# Patient Record
Sex: Female | Born: 1960 | Race: White | Hispanic: No | Marital: Single | Smoking: Never smoker
Health system: Southern US, Community
[De-identification: ages and names within clinical notes are randomized; demographics above are authoritative.]

## PROBLEM LIST (undated history)

## (undated) HISTORY — PX: PARATHYROIDECTOMY: SHX19

---

## 2001-07-17 ENCOUNTER — Ambulatory Visit (HOSPITAL_COMMUNITY): Admission: RE | Admit: 2001-07-17 | Discharge: 2001-07-17 | Payer: Self-pay | Admitting: Podiatry

## 2001-07-17 ENCOUNTER — Encounter: Payer: Self-pay | Admitting: Podiatry

## 2003-03-07 ENCOUNTER — Emergency Department (HOSPITAL_COMMUNITY): Admission: EM | Admit: 2003-03-07 | Discharge: 2003-03-07 | Payer: Self-pay | Admitting: Emergency Medicine

## 2003-05-20 ENCOUNTER — Ambulatory Visit (HOSPITAL_COMMUNITY): Admission: RE | Admit: 2003-05-20 | Discharge: 2003-05-20 | Payer: Self-pay | Admitting: Gynecology

## 2003-06-11 ENCOUNTER — Other Ambulatory Visit: Admission: RE | Admit: 2003-06-11 | Discharge: 2003-06-11 | Payer: Self-pay | Admitting: Gynecology

## 2003-06-16 ENCOUNTER — Emergency Department (HOSPITAL_COMMUNITY): Admission: EM | Admit: 2003-06-16 | Discharge: 2003-06-16 | Payer: Self-pay | Admitting: Emergency Medicine

## 2003-06-16 ENCOUNTER — Ambulatory Visit (HOSPITAL_COMMUNITY): Admission: RE | Admit: 2003-06-16 | Discharge: 2003-06-16 | Payer: Self-pay | Admitting: Gynecology

## 2003-09-28 ENCOUNTER — Emergency Department (HOSPITAL_COMMUNITY): Admission: EM | Admit: 2003-09-28 | Discharge: 2003-09-28 | Payer: Self-pay | Admitting: Emergency Medicine

## 2003-11-27 ENCOUNTER — Ambulatory Visit (HOSPITAL_COMMUNITY): Admission: RE | Admit: 2003-11-27 | Discharge: 2003-11-27 | Payer: Self-pay | Admitting: Pulmonary Disease

## 2003-12-01 ENCOUNTER — Other Ambulatory Visit: Admission: RE | Admit: 2003-12-01 | Discharge: 2003-12-01 | Payer: Self-pay | Admitting: Gynecology

## 2004-06-16 ENCOUNTER — Ambulatory Visit (HOSPITAL_COMMUNITY): Admission: RE | Admit: 2004-06-16 | Discharge: 2004-06-16 | Payer: Self-pay | Admitting: Family Medicine

## 2004-11-10 ENCOUNTER — Ambulatory Visit (HOSPITAL_COMMUNITY): Admission: RE | Admit: 2004-11-10 | Discharge: 2004-11-10 | Payer: Self-pay | Admitting: Family Medicine

## 2005-03-23 ENCOUNTER — Ambulatory Visit: Payer: Self-pay | Admitting: Cardiology

## 2005-12-19 ENCOUNTER — Encounter: Admission: RE | Admit: 2005-12-19 | Discharge: 2005-12-19 | Payer: Self-pay

## 2005-12-22 ENCOUNTER — Encounter: Admission: RE | Admit: 2005-12-22 | Discharge: 2005-12-22 | Payer: Self-pay

## 2006-01-15 ENCOUNTER — Encounter (INDEPENDENT_AMBULATORY_CARE_PROVIDER_SITE_OTHER): Payer: Self-pay | Admitting: Specialist

## 2006-01-15 ENCOUNTER — Ambulatory Visit (HOSPITAL_COMMUNITY): Admission: RE | Admit: 2006-01-15 | Discharge: 2006-01-16 | Payer: Self-pay | Admitting: General Surgery

## 2008-11-11 ENCOUNTER — Ambulatory Visit (HOSPITAL_COMMUNITY): Admission: RE | Admit: 2008-11-11 | Discharge: 2008-11-11 | Payer: Self-pay | Admitting: Internal Medicine

## 2008-12-05 ENCOUNTER — Encounter: Admission: RE | Admit: 2008-12-05 | Discharge: 2008-12-05 | Payer: Self-pay

## 2010-06-03 NOTE — Op Note (Signed)
Brandi Richardson, Brandi Richardson NO.:  [id number]   MEDICAL RECORD NO.:  [id number]          PATIENT TYPE:  OIB   LOCATION:  2550                         FACILITY:  [facility]   PHYSICIAN:  Angelia Mould. Derrell Lolling, M.D.DATE OF BIRTH:  1960-10-29   DATE OF PROCEDURE:  01/15/2006  DATE OF DISCHARGE:                               OPERATIVE REPORT   PREOPERATIVE DIAGNOSIS:  Primary hyperparathyroidism.   POSTOPERATIVE DIAGNOSIS:  Primary hyperparathyroidism.   OPERATION PERFORMED:  Minimally invasive radionuclide localized  parathyroidectomy, frozen section.   SURGEON:  Angelia Mould. Derrell Lolling, M.D.   OPERATIVE INDICATIONS:  This is a 50 year old white female who has  rheumatologic problems with joint pain.  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  She has had elevated calcium which has  been 10.9, 11.2, and 11.0.  She has significantly elevated parathyroid  hormone level.  She had a nuclear medicine parathyroid scan 3 weeks ago  which showed faint activity along the lower pole of the right lobe, only  seen on static images.  She then had an MRI which showed a small soft  tissue nodule along the inferior aspect of the right thyroid lobe;  appearing to correlate with the parathyroid scan; and possibly to  represent a parathyroid adenoma.  The patient is brought to operating  room electively for parathyroidectomy.   OPERATIVE FINDINGS:  The patient had a parathyroid adenoma in the right  inferior position, which measured 13 mm x 8 mm x 4 mm in size; and on  frozen section was consistent with parathyroid tissue.  This had  radioactivity about 1-1/2 times the background activity.   OPERATIVE TECHNIQUE:  The patient underwent injection of sestamibi  radioactive isotope by the nuclear medicine staff at 12:30 p.m.  She was  taken to the operating room.  General anesthesia with endotracheal tube  was  induced.  She was placed in a reverse Trendelenburg position with  the neck extended.  The neck and upper chest were prepped and draped in  a sterile fashion.  I used the NeoProbe; and the right inferior pole  neck appeared to have about 1-1/2 times the background activity.  A  curved incision was made from the midline laterally, on the right,  approximately 2.5 cm in length.  Dissection was carried down through the  subcutaneous tissue and through the platysma muscle.  The platysma  muscle was dissected away from the underlying strap muscles; and a self-  retaining retractor was placed.  The strap muscles were divided in the  midline; and the right strap muscles were dissected away from the  thyroid gland with gentle blunt dissection.  Some small venous channels  were controlled with metal clips and divided.   As we continued our dissection we identified what appeared to be a  parathyroid adenoma just inferior to the lower pole of the thyroid  gland.  This had a reddish brown color; and dissected away from the  thyroid tissue fairly easily.  A couple of vascular channels were  controlled  with metal clips.  The gland was measured with increased size  as described above.  Dr. Jimmy Picket performed a frozen section; and  said that this was clearly parathyroid tissue; and consistent with an  adenoma.  No further dissection was required.   The wound was irrigated with saline.  Some small Surgicel gauze was  placed in the wound; and observed for about 10 minutes during the frozen  section; there was no bleeding.  The strap muscles were closed in the  midline with interrupted sutures of 3-0 Vicryl.  The platysma muscle was  closed with interrupted suture of 3-0 Vicryl; and the skin closed with a  running subcuticular suture of 4-0 Monocryl and Steri-Strips.  Clean  bandages were placed; and the patient taken to recovery room in stable  condition.  Estimated blood loss was about 10 mL.   Complications none.  Sponge, needle, and instrument counts were correct.      Angelia Mould. Derrell Lolling, M.D.  Electronically Signed     HMI/MEDQ  D:  01/15/2006  T:  01/15/2006  Job:  [id number]   cc:   Areatha Keas, M.D.

## 2010-07-11 ENCOUNTER — Other Ambulatory Visit (HOSPITAL_COMMUNITY): Payer: Self-pay | Admitting: Family Medicine

## 2010-07-11 DIAGNOSIS — Z139 Encounter for screening, unspecified: Secondary | ICD-10-CM

## 2010-07-18 ENCOUNTER — Ambulatory Visit (HOSPITAL_COMMUNITY)
Admission: RE | Admit: 2010-07-18 | Discharge: 2010-07-18 | Disposition: A | Discharge status: Home / Self Care | Payer: BC Managed Care – PPO | Source: Ambulatory Visit | Attending: Family Medicine | Admitting: Family Medicine

## 2010-07-18 DIAGNOSIS — Z1231 Encounter for screening mammogram for malignant neoplasm of breast: Secondary | ICD-10-CM | POA: Insufficient documentation

## 2010-07-18 DIAGNOSIS — Z139 Encounter for screening, unspecified: Secondary | ICD-10-CM

## 2014-09-02 ENCOUNTER — Ambulatory Visit (HOSPITAL_COMMUNITY): Payer: BLUE CROSS/BLUE SHIELD | Attending: Internal Medicine | Admitting: Physical Therapy

## 2014-09-02 DIAGNOSIS — M545 Low back pain, unspecified: Secondary | ICD-10-CM

## 2014-09-02 DIAGNOSIS — M2569 Stiffness of other specified joint, not elsewhere classified: Secondary | ICD-10-CM

## 2014-09-02 DIAGNOSIS — M2560 Stiffness of unspecified joint, not elsewhere classified: Secondary | ICD-10-CM | POA: Insufficient documentation

## 2014-09-02 DIAGNOSIS — R198 Other specified symptoms and signs involving the digestive system and abdomen: Secondary | ICD-10-CM | POA: Diagnosis present

## 2014-09-02 DIAGNOSIS — M25652 Stiffness of left hip, not elsewhere classified: Secondary | ICD-10-CM | POA: Diagnosis present

## 2014-09-02 DIAGNOSIS — M6289 Other specified disorders of muscle: Secondary | ICD-10-CM | POA: Diagnosis present

## 2014-09-02 DIAGNOSIS — R293 Abnormal posture: Secondary | ICD-10-CM | POA: Diagnosis present

## 2014-09-02 DIAGNOSIS — R262 Difficulty in walking, not elsewhere classified: Secondary | ICD-10-CM | POA: Diagnosis present

## 2014-09-02 DIAGNOSIS — M6281 Muscle weakness (generalized): Secondary | ICD-10-CM

## 2014-09-02 DIAGNOSIS — M25651 Stiffness of right hip, not elsewhere classified: Secondary | ICD-10-CM | POA: Diagnosis present

## 2014-09-02 NOTE — Therapy (Signed)
[facility] [address] [place], [place], [address] Phone: [phone]   Fax:  [phone]  Physical Therapy Evaluation  Patient Details  Name: Brandi Richardson MRN: [id number] Date of Birth: 1960-04-18 Referring Provider:  Alben Deeds, MD  Encounter Date: 09/02/2014      PT End of Session - 09/02/14 1816    Visit Number 1   Number of Visits 12   Date for PT Re-Evaluation 09/30/14   Authorization Type BCBS    Authorization Time Period 09/02/14 to 11/02/14   PT Start Time 1733   PT Stop Time 1812   PT Time Calculation (min) 39 min   Activity Tolerance Patient tolerated treatment well   Behavior During Therapy [facility] for tasks assessed/performed      No past medical history on file.  No past surgical history on file.  There were no vitals filed for this visit.  Visit Diagnosis:  Midline low back pain without sciatica - Plan: PT plan of care cert/re-cert  Abdominal weakness - Plan: PT plan of care cert/re-cert  Proximal muscle weakness - Plan: PT plan of care cert/re-cert  Difficulty walking - Plan: PT plan of care cert/re-cert  Hip stiffness, left - Plan: PT plan of care cert/re-cert  Hip stiffness, right - Plan: PT plan of care cert/re-cert  Back stiffness - Plan: PT plan of care cert/re-cert  Poor posture - Plan: PT plan of care cert/re-cert      Subjective Assessment - 09/02/14 1736    Subjective Back is usually painful, driving sets it off. Pain feels like it pinches somewhere and runs around the front of her leg and she has numbness/pain in her quad area. Pain starts in back and moves to R hip typically.    Pertinent History  History of cyst removal L foot. Back first started bothering her around July 25th when she went back to work after being on vacation. Had driven around 604 miles for work before vacation, vacation was a 4 hour drive. Also mows the yard on a riding lawn mower, sometimes she will go over ditch  and she thinks this may have aggravated condition.    How long can you sit comfortably? 30 minutes    How long can you stand comfortably? Back gets a little sore after about 30=45 minutes    How long can you walk comfortably? R leg is going numb during gait of 60 minutes in duration    Patient Stated Goals stop back and especially R hip pain    Currently in Pain? Yes   Pain Score 3    Pain Location Back  low back and right hip   Pain Orientation Other (Comment)  low back and right hip             [facility] PT Assessment - 09/02/14 0001    Assessment   Medical Diagnosis low back pain    Onset Date/Surgical Date 08/10/14   Next MD Visit November with [facility]   Precautions   Precautions None   Restrictions   Weight Bearing Restrictions No   Balance Screen   Has the patient fallen in the past 6 months No   Has the patient had a decrease in activity level because of a fear of falling?  Yes   Is the patient reluctant to leave their home because of a fear of falling?  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    Observation/Other Assessments   Observations no N/T in bowel area; FABER positive R hip (pressure, pinching sensation); scour test negative     Focus on Therapeutic Outcomes (FOTO)  36% limited    Sensation   Additional Comments sensation to light touch in B LE dermatomes appears intact, as does pressure sensation    Posture/Postural Control   Posture Comments flexed at hips, forward head with B IR shoulders   AROM   Right Hip External Rotation  --  WFL    Right Hip Internal Rotation  28   Left Hip External Rotation  --  [facility]    Left Hip Internal Rotation  30   Lumbar Flexion 90  pain R hip    Lumbar Extension 20  pain low back and R hip    Lumbar - Right Side Bend 23   Lumbar - Left Side Bend 18   Lumbar  - Right Rotation --  approx 70% limited    Lumbar - Left Rotation --  approx 70% limited    Strength   Overall Strength Comments gross core strength appears to be 2/5 to 2+/5    Right Hip Flexion 4-/5   Right Hip Extension 2/5   Right Hip ABduction 3-/5   Left Hip Flexion 4/5   Left Hip Extension 2/5   Left Hip ABduction 3/5   Right Knee Flexion 4/5   Right Knee Extension 4/5   Left Knee Flexion 4/5   Left Knee Extension 4/5   Right Ankle Dorsiflexion 4/5   Left Ankle Dorsiflexion 5/5   Flexibility   Hamstrings minimal limitation B   Piriformis moderation limitation B    Ambulation/Gait   Gait Comments proximal muscle weakness, flexed at hips, reduced gait speed, possible LLD, reduced rotation of hips and trunk                           PT Education - 09/02/14 1816    Education provided Yes   Education Details prognosis, plan of care, HEP moving forward    Person(s) Educated Patient   Methods Explanation;Handout   Comprehension Verbalized understanding;Returned demonstration          PT Short Term Goals - 09/02/14 1824    PT SHORT TERM GOAL #1   Title Patient will be able to verbally state the importance of maintaining good posture during functional tasks and will perform all funtional tasks with good posture 80% of the time    Time 3   Period Weeks   Status New   PT SHORT TERM GOAL #2   Title Patient will demonstrate bilateral hip ER of at least 38 degrees bilaterally    Time 3   Period Weeks   Status New   PT SHORT TERM GOAL #3   Title Patient will reduce lumbar spine rotation limitations to no more than 40% limited with pain 0/10 with supine testing    Time 3   Period Weeks   Status New   PT SHORT TERM GOAL #4   Title Patient will be independent in correctly and consistently performing appropriate HEP, to be updated PRN    Time 3   Period Weeks   Status New           PT Long Term Goals - 09/02/14 1827    PT LONG TERM GOAL #1    Title Patient will  demonstrate bilateral lower extremity strength of 5/5, proximal muscle strength of at least 4/5, and core strength of at least 4-/5    Time 6   Period Weeks   Status New   PT LONG TERM GOAL #2   Title 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 no more than 1/10 and minimal numbness/tingling in R LE    Time 6   Period Weeks   Status New   PT LONG TERM GOAL #3   Title Patient to report that she has been able to perform regular exercise at a moderate intensity for at least 30 minutes, and at least 3 times per week in order to promote improved health habits and reduce symptomology    Time 3   Period Weeks   Status New   PT LONG TERM GOAL #4   Title Patient will be able to perform static standing tasks and functional gait based tasks for at least 2 hours with pain no more than 1/10 and minimal R LE numbness/tingling    Time 6   Period Weeks   Status New               Plan - 09/02/14 1817    Clinical Impression Statement Patient presents with low back and right hip pain, postural and gait impairment, weakness in bilateral lower extremities and proximal muscles/core, muscle tightness, pain and numbness/tingling with walking that goes down her R leg, reduced functional activity and task performance secondary to pain and alterred sensation.  Patient reports that it is more her R hip bothering her, as the pain starts in her back and eminates out this way with time. She also reports that the problem originally appeared to ahve started after she had been sitting, driving, for a long time for her work as a Patent examiner. Note that numbness and tingling seems to appear in region of femoral nerve on R leg. At this time patient will benefit from skilled PT services in order to address her deficits and assist her in reaching an optimal level of function.    Pt will benefit from skilled therapeutic intervention in  order to improve on the following deficits Abnormal gait;Hypomobility;Decreased activity tolerance;Decreased strength;Pain;Decreased mobility;Difficulty walking;Improper body mechanics;Decreased coordination;Impaired flexibility;Postural dysfunction   Rehab Potential Good   PT Frequency 2x / week   PT Duration 6 weeks   PT Treatment/Interventions ADLs/Self Care Home Management;Moist Heat;Gait training;Stair training;Functional mobility training;Therapeutic activities;Balance training;Therapeutic exercise;Neuromuscular re-education;Patient/family education;Manual techniques   PT Next Visit Plan review HEP and goals; functional strengthening and stretching as tolerated, trial distraction to R hip, check for leg length discrepancy    PT Home Exercise Plan given    Consulted and Agree with Plan of Care Patient         Problem List There are no active problems to display for this patient.   Nedra Hai PT, DPT [phone]  [facility] [address] [place], [place], [address] Phone: [phone]   Fax:  [phone]

## 2014-09-02 NOTE — Patient Instructions (Signed)
   BRIDGING  While lying on your back, tighten your lower abdominals, squeeze your buttocks and then raise your buttocks off the floor/bed as creating a "Bridge" with your body.  Repeat 10 times, 2-3 times a day.     HIP EXTENSION - STANDING  While standing, move your leg back as shown.  Use your arms for support if needed for balance and safety.   Repeat 10 times, 2-3 times per day.    HIP ABDUCTION - STANDING   While standing, raise your leg out to the side. Keep your knee straight and maintain your toes pointed forward the entire time.    Use your arms for support if needed for balance and safety.  Repeat 10 times, 2-3 times per day.     PIRIFORMIS AND HIP STRETCH - SEATED  While sitting in a chair, cross your affected leg on top of the other as shown.   Next, gently lean forward until a stretch is felt along the crossed leg.  Hold for 30 seconds and repeat 3 times each side, twice a day.

## 2014-09-09 ENCOUNTER — Ambulatory Visit (HOSPITAL_COMMUNITY): Payer: BLUE CROSS/BLUE SHIELD

## 2014-09-09 DIAGNOSIS — R293 Abnormal posture: Secondary | ICD-10-CM

## 2014-09-09 DIAGNOSIS — R198 Other specified symptoms and signs involving the digestive system and abdomen: Secondary | ICD-10-CM

## 2014-09-09 DIAGNOSIS — M2569 Stiffness of other specified joint, not elsewhere classified: Secondary | ICD-10-CM

## 2014-09-09 DIAGNOSIS — M545 Low back pain, unspecified: Secondary | ICD-10-CM

## 2014-09-09 DIAGNOSIS — M2560 Stiffness of unspecified joint, not elsewhere classified: Secondary | ICD-10-CM

## 2014-09-09 DIAGNOSIS — M25652 Stiffness of left hip, not elsewhere classified: Secondary | ICD-10-CM

## 2014-09-09 DIAGNOSIS — R262 Difficulty in walking, not elsewhere classified: Secondary | ICD-10-CM

## 2014-09-09 DIAGNOSIS — M6281 Muscle weakness (generalized): Secondary | ICD-10-CM

## 2014-09-09 DIAGNOSIS — M25651 Stiffness of right hip, not elsewhere classified: Secondary | ICD-10-CM

## 2014-09-09 NOTE — Therapy (Signed)
[facility] [address] [place], [place], [address] Phone: [phone]   Fax:  [phone]  Physical Therapy Treatment  Patient Details  Name: Brandi Richardson MRN: [id number] Date of Birth: February 15, 1960 Referring Provider:  Alben Deeds, MD  Encounter Date: 09/09/2014      PT End of Session - 09/09/14 1905    Visit Number 2   Number of Visits 12   Authorization Type BCBS    Authorization Time Period 09/02/14 to 11/02/14   PT Start Time 1820   PT Stop Time 1904   PT Time Calculation (min) 44 min   Activity Tolerance Patient tolerated treatment well   Behavior During Therapy [facility] for tasks assessed/performed      No past medical history on file.  No past surgical history on file.  There were no vitals filed for this visit.  Visit Diagnosis:  Midline low back pain without sciatica  Abdominal weakness  Proximal muscle weakness  Difficulty walking  Hip stiffness, left  Hip stiffness, right  Back stiffness  Poor posture      Subjective Assessment - 09/09/14 1820    Subjective Pt stated Rt hip pain with burning pain scale 2/10, usually pain following sitting to stand.  Reports compliance with HEP daily without questions.       Currently in Pain? Yes   Pain Score 2    Pain Location Hip   Pain Orientation Right   Pain Descriptors / Indicators Burning            [facility] Adult PT Treatment/Exercise - 09/09/14 0001    Exercises   Exercises Lumbar   Lumbar Exercises: Stretches   Active Hamstring Stretch 3 reps;30 seconds   Active Hamstring Stretch Limitations supine with rope   Lower Trunk Rotation 5 reps;10 seconds   Piriformis Stretch 3 reps;30 seconds   Piriformis Stretch Limitations seated    Lumbar Exercises: Standing   Other Standing Lumbar Exercises 3D hip excursion 10x   Other Standing Lumbar Exercises Standing abduction 10x (cueing for form and to slow down)   Lumbar Exercises: Seated   Other Seated  Lumbar Exercises 3D thoracic excursion 10   Lumbar Exercises: Supine   Ab Set 10 reps;5 seconds   AB Set Limitations verbal cueing   Bent Knee Raise 10 reps;5 seconds   Bridge 10 reps   Straight Leg Raise 10 reps            PT Short Term Goals - 09/02/14 1824    PT SHORT TERM GOAL #1   Title Patient will be able to verbally state the importance of maintaining good posture during functional tasks and will perform all funtional tasks with good posture 80% of the time    Time 3   Period Weeks   Status New   PT SHORT TERM GOAL #2   Title Patient will demonstrate bilateral hip ER of at least 38 degrees bilaterally    Time 3   Period Weeks   Status New   PT SHORT TERM GOAL #3   Title Patient will reduce lumbar spine rotation limitations to no more than 40% limited with pain 0/10 with supine testing    Time 3   Period Weeks   Status New   PT SHORT TERM GOAL #4   Title Patient will be independent in correctly and consistently performing appropriate HEP, to be updated PRN    Time 3   Period Weeks   Status New  PT Long Term Goals - 09/02/14 1827    PT LONG TERM GOAL #1   Title Patient will demonstrate bilateral lower extremity strength of 5/5, proximal muscle strength of at least 4/5, and core strength of at least 4-/5    Time 6   Period Weeks   Status New   PT LONG TERM GOAL #2   Title 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 no more than 1/10 and minimal numbness/tingling in R LE    Time 6   Period Weeks   Status New   PT LONG TERM GOAL #3   Title Patient to report that she has been able to perform regular exercise at a moderate intensity for at least 30 minutes, and at least 3 times per week in order to promote improved health habits and reduce symptomology    Time 3   Period Weeks   Status New   PT LONG TERM GOAL #4   Title Patient will be able to perform static standing tasks and functional  gait based tasks for at least 2 hours with pain no more than 1/10 and minimal R LE numbness/tingling    Time 6   Period Weeks   Status New               Plan - 09/09/14 1905    Clinical Impression Statement Reviewed goals, compliance with HEP and questions answered about exercises and copy of evaluation given to pt.  Session focus on improving spinal mobilty to improve posture and reduce stiffness.  Pt educated on importance of proper posture and landmarks given.  Therex focus on proximal musculature strengthening with cueing for form and techniques.  Pt educated on proper bed mobility, pt stated she has vertigo and reported dizziness from sidelying to stting.  Dizziness resolved prior leaving dept.  No reports of pain through session.   PT Next Visit Plan Functional strengthening and stretching as tolerated, trial distraction to R hip, check for leg length discrepancy.  Begin sidelying abduction next session (be aware of vertigo in position)        Problem List There are no active problems to display for this patient.  [address] LPTA; CBIS [phone]  Juel Burrow 09/09/2014, 7:10 PM  [facility] [address] [place], [place], [address] Phone: [phone]   Fax:  [phone]

## 2014-09-22 ENCOUNTER — Ambulatory Visit (HOSPITAL_COMMUNITY): Payer: BLUE CROSS/BLUE SHIELD | Attending: Internal Medicine

## 2014-09-22 DIAGNOSIS — M2560 Stiffness of unspecified joint, not elsewhere classified: Secondary | ICD-10-CM | POA: Diagnosis present

## 2014-09-22 DIAGNOSIS — R198 Other specified symptoms and signs involving the digestive system and abdomen: Secondary | ICD-10-CM | POA: Diagnosis present

## 2014-09-22 DIAGNOSIS — M6289 Other specified disorders of muscle: Secondary | ICD-10-CM | POA: Diagnosis present

## 2014-09-22 DIAGNOSIS — M2569 Stiffness of other specified joint, not elsewhere classified: Secondary | ICD-10-CM

## 2014-09-22 DIAGNOSIS — M545 Low back pain, unspecified: Secondary | ICD-10-CM

## 2014-09-22 DIAGNOSIS — M25651 Stiffness of right hip, not elsewhere classified: Secondary | ICD-10-CM

## 2014-09-22 DIAGNOSIS — M6281 Muscle weakness (generalized): Secondary | ICD-10-CM

## 2014-09-22 DIAGNOSIS — R262 Difficulty in walking, not elsewhere classified: Secondary | ICD-10-CM | POA: Insufficient documentation

## 2014-09-22 DIAGNOSIS — M25652 Stiffness of left hip, not elsewhere classified: Secondary | ICD-10-CM

## 2014-09-22 DIAGNOSIS — R293 Abnormal posture: Secondary | ICD-10-CM | POA: Diagnosis present

## 2014-09-22 NOTE — Therapy (Signed)
[place], [place], [address] Phone: [phone]   Fax:  [phone]  Physical Therapy Treatment  Patient Details  Name: Brandi Richardson MRN: [id number] Date of Birth: January 31, 1960 Referring Provider:  Leigh Aurora, MD  Encounter Date: 09/22/2014      PT End of Session - 09/22/14 1751    Visit Number 3   Number of Visits 12   Date for PT Re-Evaluation 09/30/14   Authorization Type BCBS    Authorization Time Period 09/02/14 to 11/02/14   PT Start Time 1731   PT Stop Time 1816   PT Time Calculation (min) 45 min   Activity Tolerance Patient tolerated treatment well   Behavior During Therapy [facility] for tasks assessed/performed      No past medical history on file.  No past surgical history on file.  There were no vitals filed for this visit.  Visit Diagnosis:  Midline low back pain without sciatica  Abdominal weakness  Proximal muscle weakness  Hip stiffness, left  Hip stiffness, right  Back stiffness  Difficulty walking  Poor posture      Subjective Assessment - 09/22/14 1732    Subjective Pt stated she is pain free, main complaint with sinus problems today.   Currently in Pain? No/denies                         [facility] Adult PT Treatment/Exercise - 09/22/14 0001    Exercises   Exercises Lumbar   Lumbar Exercises: Stretches   Active Hamstring Stretch 3 reps;30 seconds   Active Hamstring Stretch Limitations 14in step   Single Knee to Chest Stretch 2 reps;20 seconds   Piriformis Stretch 3 reps;30 seconds   Piriformis Stretch Limitations seated    Lumbar Exercises: Aerobic   Tread Mill 1.2 mph following MET at incline 3% x 5 minutes   Lumbar Exercises: Seated   Other Seated Lumbar Exercises Ab sets 10x5"   Lumbar Exercises: Supine   Ab Set 10 reps;5 seconds   AB Set Limitations verbal cueing   Bent Knee Raise 10 reps;5 seconds   Bridge 15 reps   Straight Leg Raise 10  reps   Lumbar Exercises: Sidelying   Hip Abduction Limitations held due to vertigo   Manual Therapy   Manual Therapy Muscle Energy Technique   Muscle Energy Technique MET for Rt SI anterior rotation f/b gait training on TM and core strengthening             PT Short Term Goals - 09/02/14 1824    PT SHORT TERM GOAL #1   Title Patient will be able to verbally state the importance of maintaining good posture during functional tasks and will perform all funtional tasks with good posture 80% of the time    Time 3   Period Weeks   Status New   PT SHORT TERM GOAL #2   Title Patient will demonstrate bilateral hip ER of at least 38 degrees bilaterally    Time 3   Period Weeks   Status New   PT SHORT TERM GOAL #3   Title Patient will reduce lumbar spine rotation limitations to no more than 40% limited with pain 0/10 with supine testing    Time 3   Period Weeks   Status New   PT SHORT TERM GOAL #4   Title Patient will be independent in correctly and consistently performing appropriate HEP, to be updated PRN  Time 3   Period Weeks   Status New           PT Long Term Goals - 09/02/14 1827    PT LONG TERM GOAL #1   Title Patient will demonstrate bilateral lower extremity strength of 5/5, proximal muscle strength of at least 4/5, and core strength of at least 4-/5    Time 6   Period Weeks   Status New   PT LONG TERM GOAL #2   Title 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 no more than 1/10 and minimal numbness/tingling in R LE    Time 6   Period Weeks   Status New   PT LONG TERM GOAL #3   Title Patient to report that she has been able to perform regular exercise at a moderate intensity for at least 30 minutes, and at least 3 times per week in order to promote improved health habits and reduce symptomology    Time 3   Period Weeks   Status New   PT LONG TERM GOAL #4   Title Patient will be able to perform  static standing tasks and functional gait based tasks for at least 2 hours with pain no more than 1/10 and minimal R LE numbness/tingling    Time 6   Period Weeks   Status New               Plan - 09/22/14 1810    Clinical Impression Statement Muscle energy technique for Rt SI anterior rotation with improved leg discrepency and no reports of pain or tenderness over Rt PSIS.  Session focus on core strengthening to assist with SI alingment.  Held the plan for sidelying abduction due to sinus problems and vertigo.  No reports of pain at end of session, pt encouraged to continue with pelvic floor contractions to keep SI in alignment.   PT Next Visit Plan Functional strengthening and stretching as tolerated, trial distraction to R hip, check for leg length discrepancy.         Problem List There are no active problems to display for this patient.  [address] LPTA; CBIS [phone]  Aldona Lento 09/22/2014, 6:19 PM  [place] [address] [place], [place], [address] Phone: [phone]   Fax:  [phone]

## 2014-09-24 ENCOUNTER — Ambulatory Visit (HOSPITAL_COMMUNITY): Payer: BLUE CROSS/BLUE SHIELD

## 2014-09-24 DIAGNOSIS — M2560 Stiffness of unspecified joint, not elsewhere classified: Secondary | ICD-10-CM

## 2014-09-24 DIAGNOSIS — M2569 Stiffness of other specified joint, not elsewhere classified: Secondary | ICD-10-CM

## 2014-09-24 DIAGNOSIS — M545 Low back pain, unspecified: Secondary | ICD-10-CM

## 2014-09-24 DIAGNOSIS — R293 Abnormal posture: Secondary | ICD-10-CM

## 2014-09-24 DIAGNOSIS — M25652 Stiffness of left hip, not elsewhere classified: Secondary | ICD-10-CM

## 2014-09-24 DIAGNOSIS — M25651 Stiffness of right hip, not elsewhere classified: Secondary | ICD-10-CM

## 2014-09-24 DIAGNOSIS — R198 Other specified symptoms and signs involving the digestive system and abdomen: Secondary | ICD-10-CM

## 2014-09-24 DIAGNOSIS — R262 Difficulty in walking, not elsewhere classified: Secondary | ICD-10-CM

## 2014-09-24 DIAGNOSIS — M6281 Muscle weakness (generalized): Secondary | ICD-10-CM

## 2014-09-24 NOTE — Therapy (Signed)
[place], [place], [address] Phone: [phone]   Fax:  [phone]  Physical Therapy Treatment  Patient Details  Name: Brandi Richardson MRN: [id number] Date of Birth: 1960/01/30 Referring Provider:  Leigh Aurora, MD  Encounter Date: 09/24/2014      PT End of Session - 09/24/14 1608    Visit Number 4   Number of Visits 12   Date for PT Re-Evaluation 09/30/14   Authorization Type BCBS    Authorization Time Period 09/02/14 to 11/02/14   PT Start Time 1547   PT Stop Time 1640   PT Time Calculation (min) 53 min   Activity Tolerance Patient tolerated treatment well   Behavior During Therapy [facility] for tasks assessed/performed      No past medical history on file.  No past surgical history on file.  There were no vitals filed for this visit.  Visit Diagnosis:  Midline low back pain without sciatica  Abdominal weakness  Proximal muscle weakness  Hip stiffness, left  Hip stiffness, right  Back stiffness  Difficulty walking  Poor posture      Subjective Assessment - 09/24/14 1549    Subjective Pt reported no back or hip pain today.  Continues to have sinus problems.  Reports increase ease with piriformis and no reports of tingling to Rt foot following MET last session.  Has been completeing pelvic floor contractions throughout her day.   Currently in Pain? No/denies            [facility] PT Assessment - 09/24/14 0001    Assessment   Medical Diagnosis low back pain    Onset Date/Surgical Date 08/10/14   Next MD Visit November with [facility] PT Treatment/Exercise - 09/24/14 0001    Exercises   Exercises Lumbar   Lumbar Exercises: Aerobic   Tread Mill 1.2 mph following MET at incline 3% x 5 minutes   Lumbar Exercises: Standing   Functional Squats 15 reps   Functional Squats Limitations 3D hip excursion 10x   Scapular Retraction 10 reps;Theraband   Theraband Level  (Scapular Retraction) Level 3 (Green)   Row 10 reps;Theraband   Theraband Level (Row) Level 3 (Green)   Shoulder Extension Both;10 reps;Theraband   Theraband Level (Shoulder Extension) Level 3 (Green)   Other Standing Lumbar Exercises Rockerboard 2 min R/L and A/P   Other Standing Lumbar Exercises Standing abduction 15x (min cueing for form)   Lumbar Exercises: Supine   Ab Set 10 reps;5 seconds   AB Set Limitations verbal cueing   Bent Knee Raise 10 reps;5 seconds   Bridge 15 reps   Straight Leg Raise 10 reps   Lumbar Exercises: Prone   Straight Leg Raise 10 reps;3 seconds   Other Prone Lumbar Exercises heel squeeze 5x 5"   Manual Therapy   Manual Therapy Muscle Energy Technique   Muscle Energy Technique MET for Rt SI anterior rotation f/b gait training on TM and core strengthening           PT Education - 09/24/14 1608    Education provided Yes   Education Details Educated importance of proper posture for back pain control   Person(s) Educated Patient   Methods Explanation   Comprehension Verbalized understanding          PT Short Term Goals - 09/02/14 1824    PT SHORT TERM GOAL #1   Title Patient will  be able to verbally state the importance of maintaining good posture during functional tasks and will perform all funtional tasks with good posture 80% of the time    Time 3   Period Weeks   Status New   PT SHORT TERM GOAL #2   Title Patient will demonstrate bilateral hip ER of at least 38 degrees bilaterally    Time 3   Period Weeks   Status New   PT SHORT TERM GOAL #3   Title Patient will reduce lumbar spine rotation limitations to no more than 40% limited with pain 0/10 with supine testing    Time 3   Period Weeks   Status New   PT SHORT TERM GOAL #4   Title Patient will be independent in correctly and consistently performing appropriate HEP, to be updated PRN    Time 3   Period Weeks   Status New           PT Long Term Goals - 09/02/14 1827    PT  LONG TERM GOAL #1   Title Patient will demonstrate bilateral lower extremity strength of 5/5, proximal muscle strength of at least 4/5, and core strength of at least 4-/5    Time 6   Period Weeks   Status New   PT LONG TERM GOAL #2   Title 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 no more than 1/10 and minimal numbness/tingling in R LE    Time 6   Period Weeks   Status New   PT LONG TERM GOAL #3   Title Patient to report that she has been able to perform regular exercise at a moderate intensity for at least 30 minutes, and at least 3 times per week in order to promote improved health habits and reduce symptomology    Time 3   Period Weeks   Status New   PT LONG TERM GOAL #4   Title Patient will be able to perform static standing tasks and functional gait based tasks for at least 2 hours with pain no more than 1/10 and minimal R LE numbness/tingling    Time 6   Period Weeks   Status New               Plan - 09/24/14 1608    Clinical Impression Statement Session focus on core, postural and functional LE strengthening.  Noted slight Rt SI anterior rotation, muscule energy technique complete to improve alignment with reports of tenderness over Rt PSIS and improve ability with piriformis stretch with alignment.  Pt given HEP to improve core and gluteal strenghteing.  Pt educated on importance of proper posture landmarks to assist with back pain control.  Began postural strengthening with theraband, progressed gluteal strenghtening with prone exercises as well.  Pt able to demonstrate all exercises correclty with cueing for stabilty and control.  No reports of pain through session.     PT Next Visit Plan Functional strengthening and stretching as tolerated, trial distraction to R hip, check for leg length discrepancy PRN.         Problem List There are no active problems to display for this patient.  [address]  LPTA; [place]  Aldona Lento 09/24/2014, 4:59 PM  [facility] [address] [place], [place], [address] Phone: [phone]   Fax:  [phone]

## 2014-09-24 NOTE — Patient Instructions (Signed)
Bent Leg Lift (Hook-Lying)   Tighten stomach and slowly raise right leg ____ inches from floor. Keep trunk rigid. Hold 5 seconds. Repeat 10-20 times per set. Do 1-2 sets per session.   http://orth.exer.us/1090   Copyright  VHI. All rights reserved.  Heel Squeeze (Prone)   Abdomen supported, bend knees and gently squeeze heels together. Hold 5 seconds. Repeat 10-20 times per set. Do 1-2 sets per session.  http://orth.exer.us/1080   Copyright  VHI. All rights reserved.  FUNCTIONAL MOBILITY: Squat   Stance: shoulder-width on floor. Bend hips and knees. Keep back straight. Do not allow knees to bend past toes. Squeeze glutes and quads to stand. 10-20 reps per set, 1-2 sets per day.  Copyright  VHI. All rights reserved.

## 2014-09-30 ENCOUNTER — Ambulatory Visit (HOSPITAL_COMMUNITY): Payer: BLUE CROSS/BLUE SHIELD

## 2014-09-30 DIAGNOSIS — M545 Low back pain, unspecified: Secondary | ICD-10-CM

## 2014-09-30 DIAGNOSIS — M6281 Muscle weakness (generalized): Secondary | ICD-10-CM

## 2014-09-30 DIAGNOSIS — R198 Other specified symptoms and signs involving the digestive system and abdomen: Secondary | ICD-10-CM

## 2014-09-30 DIAGNOSIS — M2560 Stiffness of unspecified joint, not elsewhere classified: Secondary | ICD-10-CM

## 2014-09-30 DIAGNOSIS — M25652 Stiffness of left hip, not elsewhere classified: Secondary | ICD-10-CM

## 2014-09-30 DIAGNOSIS — M2569 Stiffness of other specified joint, not elsewhere classified: Secondary | ICD-10-CM

## 2014-09-30 DIAGNOSIS — R262 Difficulty in walking, not elsewhere classified: Secondary | ICD-10-CM

## 2014-09-30 DIAGNOSIS — R293 Abnormal posture: Secondary | ICD-10-CM

## 2014-09-30 DIAGNOSIS — M25651 Stiffness of right hip, not elsewhere classified: Secondary | ICD-10-CM

## 2014-09-30 NOTE — Therapy (Signed)
[facility] [address] [place], [place], [address] Phone: [phone]   Fax:  [phone]  Physical Therapy Treatment  Patient Details  Name: Brandi Richardson MRN: [id number] Date of Birth: 05-09-1960 Referring Provider:  Donnetta Hail, MD  Encounter Date: 09/30/2014      PT End of Session - 09/30/14 1743    Visit Number 5   Number of Visits 12   Date for PT Re-Evaluation 09/30/14   Authorization Type BCBS    Authorization Time Period 09/02/14 to 11/02/14   PT Start Time 1736   PT Stop Time 1825   PT Time Calculation (min) 49 min   Activity Tolerance Patient tolerated treatment well   Behavior During Therapy [facility] for tasks assessed/performed      No past medical history on file.  No past surgical history on file.  There were no vitals filed for this visit.  Visit Diagnosis:  Midline low back pain without sciatica  Abdominal weakness  Proximal muscle weakness  Hip stiffness, left  Hip stiffness, right  Back stiffness  Difficulty walking  Poor posture      Subjective Assessment - 09/30/14 1737    Subjective Pt reported ability to drive 102  miles today with work and no radicating pain and able to sit still without fidgeting at all.     Pertinent History  History of cyst removal L foot. Back first started bothering her around July 25th when she went back to work after being on vacation. Had driven around 604 miles for work before vacation, vacation was a 4 hour drive. Also mows the yard on a riding lawn mower, sometimes she will go over ditch and she thinks this may have aggravated condition.    How long can you sit comfortably? Able to sit comfortably for 1 hour (was 30 minutes)   How long can you stand comfortably? Able to stand comfortably for an hour (Back gets a little sore after about 30=45 minutes)   How long can you walk comfortably? Able to walk comfortable with no numbness for several hours with no pain  (was R leg is going numb during gait of 60 minutes in duration)   Currently in Pain? No/denies            [facility] PT Assessment - 09/30/14 0001    Assessment   Medical Diagnosis low back pain    Onset Date/Surgical Date 08/10/14   Next MD Visit November with [facility]   Posture/Postural Control   Posture Comments Improved awareness of posture with no cueing required  flexed at hips, forward head with B IR shoulders   AROM   Right Hip External Rotation  --  WNL   Right Hip Internal Rotation  40  was 28   Left Hip External Rotation  --  WNL   Left Hip Internal Rotation  38  was 30   Lumbar Flexion 110  was 90   Lumbar Extension 25  was 20   Lumbar - Right Side Bend WNL  was 23   Lumbar - Left Side Bend WNL  was 18   Lumbar - Right Rotation WNL  was 70% limited   Lumbar - Left Rotation WNL  was 70% limited   Strength   Overall Strength Comments gross core strength 2+/5  gross core strength appears to be 2/5 to 2+/5    Right Hip Flexion 5/5  was 4-/5   Right Hip Extension 4/5  was 2/5  Right Hip ABduction 4/5  was 3-/5   Left Hip Flexion 5/5  was 4/5   Left Hip Extension 4/5  was 2/5   Left Hip ABduction 4/5  was 3/5   Right Knee Flexion 4+/5  was 4/5   Right Knee Extension 5/5  was 4/5   Left Knee Flexion 4+/5  was 4/5   Left Knee Extension 5/5  was 4/5   Right Ankle Dorsiflexion --  was 4/5   Left Ankle Dorsiflexion 5/5   Flexibility   Hamstrings WNL   Piriformis minimal limited           [facility] Adult PT Treatment/Exercise - 09/30/14 0001    Lumbar Exercises: Stretches   Active Hamstring Stretch 3 reps;30 seconds   Active Hamstring Stretch Limitations 14in step   Piriformis Stretch 3 reps;30 seconds   Piriformis Stretch Limitations seated    Lumbar Exercises: Standing   Functional Squats 15 reps   Functional Squats Limitations 3D hip excursion 10x   Other Standing Lumbar Exercises Sidestepping with RTB 1RT   Lumbar Exercises: Supine   Bent  Knee Raise 10 reps;5 seconds   Bent Knee Raise Limitations dead bug   Other Supine Lumbar Exercises Transfer training            PT Short Term Goals - 09/30/14 1743    PT SHORT TERM GOAL #1   Title Patient will be able to verbally state the importance of maintaining good posture during functional tasks and will perform all funtional tasks with good posture 80% of the time    Baseline 09/30/2014 Minimal cueing required to reduce forward rolled shoulders   Status Achieved   PT SHORT TERM GOAL #2   Title Patient will demonstrate bilateral hip ER of at least 38 degrees bilaterally    Status Achieved   PT SHORT TERM GOAL #3   Title Patient will reduce lumbar spine rotation limitations to no more than 40% limited with pain 0/10 with supine testing    Status Achieved   PT SHORT TERM GOAL #4   Title Patient will be independent in correctly and consistently performing appropriate HEP, to be updated PRN    Baseline 09/30/2014: Reports compliance with HEP daily    Status Achieved           PT Long Term Goals - 09/30/14 1746    PT LONG TERM GOAL #1   Title Patient will demonstrate bilateral lower extremity strength of 5/5, proximal muscle strength of at least 4/5, and core strength of at least 4-/5    Status On-going   PT LONG TERM GOAL #2   Title 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   Status On-going   PT LONG TERM GOAL #3   Title Patient to report that she has been able to perform regular exercise at a moderate intensity for at least 30 minutes, and at least 3 times per week in order to promote improved health habits and reduce symptomology    Status Achieved   PT LONG TERM GOAL #4   Title Patient will be able to perform static standing tasks and functional gait based tasks  for at least 2 hours with pain no more than 1/10 and minimal R LE numbness/tingling  Status Achieved               Plan - 09/30/14 1826    Clinical Impression Statement Reassessment complete with the following findings:  Pt reports independence with HEP and able to verbalize and demonstrate appropriate technique with all exercises.  Pt demonstrated improved awareness of posture with minimal cueing for forward shoulders.  Pt reports no pain in back or radicular symptoms.  ROM and strengthen are progressing extremely well.  Pt does continue to demonstrate weak core and gluteal muscualture.  Following discussion with pt. decision made to reduce frequency to 1x a week for 3-4 more weeks to address core strengthening, postural strengtheing, gluteal strengtheining and assuring proper lifting techniques with work.  Pt given advanced HEP to address gluteal and core musculature.     PT Next Visit Plan Recommend continuing OPPT for 3-4 more week 1x a week to address core strengthening, proper body mechanics with lifting with work, and posture strengthening.          Problem List There are no active problems to display for this patient.  Becky Sax, LPTA; CBIS [phone]  Juel Burrow 09/30/2014, 6:35 PM   Physical Therapy Progress Note  Dates of Reporting Period: 09/02/14 to 09/30/14  Objective Reports of Subjective Statement: see above   Objective Measurements: see above   Goal Update: see above   Plan: see above   Reason Skilled Services are Required: functional strength, functional lifting mechanics, posture, core work, development of appropriate advanced HEP   Nedra Hai PT, DPT [phone]     [facility] [address] [place], [place], [address] Phone: [phone]   Fax:  [phone]

## 2014-10-01 ENCOUNTER — Encounter (HOSPITAL_COMMUNITY): Payer: BLUE CROSS/BLUE SHIELD

## 2014-10-14 ENCOUNTER — Ambulatory Visit (HOSPITAL_COMMUNITY): Payer: BLUE CROSS/BLUE SHIELD

## 2014-10-14 ENCOUNTER — Telehealth (HOSPITAL_COMMUNITY): Payer: Self-pay

## 2014-10-14 DIAGNOSIS — R262 Difficulty in walking, not elsewhere classified: Secondary | ICD-10-CM

## 2014-10-14 DIAGNOSIS — M545 Low back pain, unspecified: Secondary | ICD-10-CM

## 2014-10-14 DIAGNOSIS — M6281 Muscle weakness (generalized): Secondary | ICD-10-CM

## 2014-10-14 DIAGNOSIS — R293 Abnormal posture: Secondary | ICD-10-CM

## 2014-10-14 DIAGNOSIS — M25652 Stiffness of left hip, not elsewhere classified: Secondary | ICD-10-CM

## 2014-10-14 DIAGNOSIS — M2560 Stiffness of unspecified joint, not elsewhere classified: Secondary | ICD-10-CM

## 2014-10-14 DIAGNOSIS — M25651 Stiffness of right hip, not elsewhere classified: Secondary | ICD-10-CM

## 2014-10-14 DIAGNOSIS — R198 Other specified symptoms and signs involving the digestive system and abdomen: Secondary | ICD-10-CM

## 2014-10-14 DIAGNOSIS — M2569 Stiffness of other specified joint, not elsewhere classified: Secondary | ICD-10-CM

## 2014-10-14 NOTE — Therapy (Signed)
[facility] [address] [place], [place], [address] Phone: [phone]   Fax:  [phone]  Physical Therapy Treatment  Patient Details  Name: Brandi Richardson MRN: [id number] Date of Birth: [birth year] Referring Provider:  Donnetta Hail, MD  Encounter Date: 10/14/2014      PT End of Session - 10/14/14 1651    Visit Number 6   Number of Visits 12   Authorization Type BCBS    Authorization Time Period 09/02/14 to 11/02/14   PT Start Time 1607   PT Stop Time 1650   PT Time Calculation (min) 43 min   Activity Tolerance Patient tolerated treatment well   Behavior During Therapy [facility] for tasks assessed/performed      No past medical history on file.  No past surgical history on file.  There were no vitals filed for this visit.  Visit Diagnosis:  Midline low back pain without sciatica  Abdominal weakness  Proximal muscle weakness  Hip stiffness, left  Hip stiffness, right  Back stiffness  Difficulty walking  Poor posture      Subjective Assessment - 10/14/14 1614    Subjective Pt stated 90 miles yesterday and able to complete work in sitting position with forward flexion    Currently in Pain? No/denies                         [facility] Adult PT Treatment/Exercise - 10/14/14 0001    Exercises   Exercises Lumbar   Lumbar Exercises: Standing   Functional Squats 15 reps   Functional Squats Limitations 3D hip excursion 10x   Lifting From 12";10 reps   Lifting Weights (lbs) Proper lifting yellow ball from 12 then heel raise with ab set   Scapular Retraction 15 reps;Theraband   Theraband Level (Scapular Retraction) Level 3 (Green)   Row 15 reps;Theraband   Theraband Level (Row) Level 3 (Green)   Shoulder Extension 15 reps;Theraband   Theraband Level (Shoulder Extension) Level 3 (Green)   Other Standing Lumbar Exercises UE flexion infront of door with ab set   Other Standing Lumbar Exercises UE  overhead matrix 5x each direction with 2#   Lumbar Exercises: Supine   Large Ball Abdominal Isometric 10 reps;5 seconds   Large Ball Oblique Isometric 10 reps;5 seconds              PT Short Term Goals - 09/30/14 1743    PT SHORT TERM GOAL #1   Title Patient will be able to verbally state the importance of maintaining good posture during functional tasks and will perform all funtional tasks with good posture 80% of the time    Baseline 09/30/2014 Minimal cueing required to reduce forward rolled shoulders   Status Achieved   PT SHORT TERM GOAL #2   Title Patient will demonstrate bilateral hip ER of at least 38 degrees bilaterally    Status Achieved   PT SHORT TERM GOAL #3   Title Patient will reduce lumbar spine rotation limitations to no more than 40% limited with pain 0/10 with supine testing    Status Achieved   PT SHORT TERM GOAL #4   Title Patient will be independent in correctly and consistently performing appropriate HEP, to be updated PRN    Baseline 09/30/2014: Reports compliance with HEP daily    Status Achieved           PT Long Term Goals - 09/30/14 1746    PT LONG  TERM GOAL #1   Title Patient will demonstrate bilateral lower extremity strength of 5/5, proximal muscle strength of at least 4/5, and core strength of at least 4-/5    Status On-going   PT LONG TERM GOAL #2   Title 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   Status On-going   PT LONG TERM GOAL #3   Title Patient to report that she has been able to perform regular exercise at a moderate intensity for at least 30 minutes, and at least 3 times per week in order to promote improved health habits and reduce symptomology    Status Achieved   PT LONG TERM GOAL #4   Title Patient will be able to  perform static standing tasks and functional gait based tasks for at least 2 hours with pain no more than 1/10 and minimal R LE numbness/tingling    Status Achieved               Plan - 10/14/14 1744    Clinical Impression Statement Session focus on improving core and postural strengthening and reviewing body mechanics with lifting at work.  Pt able to complete all exericses with min cueing for technque and control with no reports of pain through session.  Noted core instability with new activities due to weakness. Pt was limited by fatigue with increase activity demand.     PT Next Visit Plan Continue current PT POC focusing on core strengthening, proper body mechanics with lifting with work, and posture strengthening.          Problem List There are no active problems to display for this patient.  [address] LPTA; CBIS [phone]  Juel Burrow 10/14/2014, 5:50 PM  [facility] [address] [place], [place], [address] Phone: [phone]   Fax:  [phone]

## 2014-10-14 NOTE — Telephone Encounter (Signed)
Called and left message for apt. time openings earlier today.  [address] LPTA; CBIS [phone]

## 2014-10-23 ENCOUNTER — Ambulatory Visit (HOSPITAL_COMMUNITY): Payer: BLUE CROSS/BLUE SHIELD | Attending: Internal Medicine

## 2014-10-23 DIAGNOSIS — M25652 Stiffness of left hip, not elsewhere classified: Secondary | ICD-10-CM | POA: Insufficient documentation

## 2014-10-23 DIAGNOSIS — M545 Low back pain, unspecified: Secondary | ICD-10-CM

## 2014-10-23 DIAGNOSIS — R198 Other specified symptoms and signs involving the digestive system and abdomen: Secondary | ICD-10-CM

## 2014-10-23 DIAGNOSIS — M2560 Stiffness of unspecified joint, not elsewhere classified: Secondary | ICD-10-CM | POA: Insufficient documentation

## 2014-10-23 DIAGNOSIS — M6289 Other specified disorders of muscle: Secondary | ICD-10-CM | POA: Insufficient documentation

## 2014-10-23 DIAGNOSIS — M6281 Muscle weakness (generalized): Secondary | ICD-10-CM

## 2014-10-23 DIAGNOSIS — M25651 Stiffness of right hip, not elsewhere classified: Secondary | ICD-10-CM | POA: Diagnosis present

## 2014-10-23 DIAGNOSIS — R293 Abnormal posture: Secondary | ICD-10-CM | POA: Diagnosis present

## 2014-10-23 DIAGNOSIS — R262 Difficulty in walking, not elsewhere classified: Secondary | ICD-10-CM | POA: Diagnosis present

## 2014-10-23 DIAGNOSIS — M2569 Stiffness of other specified joint, not elsewhere classified: Secondary | ICD-10-CM

## 2014-10-23 NOTE — Therapy (Signed)
[facility] [address] [place], [place], [address] Phone: [phone]   Fax:  [phone]  Physical Therapy Treatment  Patient Details  Name: Brandi Richardson MRN: [id number] Date of Birth: 1960-04-06 Referring Provider:  Donnetta Hail, MD  Encounter Date: 10/23/2014      PT End of Session - 10/23/14 1727    Visit Number 7   Number of Visits 12   Date for PT Re-Evaluation 10/28/14   Authorization Type BCBS    Authorization Time Period 09/02/14 to 11/02/14   PT Start Time 1728   PT Stop Time 1812   PT Time Calculation (min) 44 min   Activity Tolerance Patient tolerated treatment well   Behavior During Therapy [facility] for tasks assessed/performed      No past medical history on file.  No past surgical history on file.  There were no vitals filed for this visit.  Visit Diagnosis:  Midline low back pain without sciatica  Abdominal weakness  Proximal muscle weakness  Hip stiffness, left  Hip stiffness, right  Back stiffness  Difficulty walking  Poor posture      Subjective Assessment - 10/23/14 1726    Subjective Pt stated she is feeling good today, no pain today.   Pertinent History  History of cyst removal L foot. Back first started bothering her around July 25th when she went back to work after being on vacation. Had driven around 604 miles for work before vacation, vacation was a 4 hour drive. Also mows the yard on a riding lawn mower, sometimes she will go over ditch and she thinks this may have aggravated condition.    Currently in Pain? No/denies                         [facility] Adult PT Treatment/Exercise - 10/23/14 0001    Lumbar Exercises: Stretches   Passive Hamstring Stretch Limitations instructed calf stretch following c/o cramping at night   ITB Stretch 3 reps;30 seconds   Piriformis Stretch 3 reps;30 seconds   Piriformis Stretch Limitations seated    Lumbar Exercises: Standing    Functional Squats 15 reps   Functional Squats Limitations 3D hip excursion 10x   Lifting From 12";10 reps   Lifting Weights (lbs) Proper lifting yellow ball from 12 then heel raise with ab set   Scapular Retraction 15 reps;Theraband   Theraband Level (Scapular Retraction) Level 3 (Green)   Scapular Retraction Limitations HEP   Row 15 reps;Theraband   Theraband Level (Row) Level 3 (Green)   Row Limitations HEP   Shoulder Extension 15 reps;Theraband   Theraband Level (Shoulder Extension) Level 3 (Green)   Shoulder Extension Limitations HEP   Other Standing Lumbar Exercises UE flexion infront of door with ab set   Other Standing Lumbar Exercises UE overhead matrix 5x each direction with 2#   Lumbar Exercises: Supine   Large Ball Abdominal Isometric 10 reps;5 seconds   Large Ball Oblique Isometric 10 reps;5 seconds   Lumbar Exercises: Quadruped   Straight Leg Raise 10 reps;5 seconds                  PT Short Term Goals - 09/30/14 1743    PT SHORT TERM GOAL #1   Title Patient will be able to verbally state the importance of maintaining good posture during functional tasks and will perform all funtional tasks with good posture 80% of the time    Baseline 09/30/2014  Minimal cueing required to reduce forward rolled shoulders   Status Achieved   PT SHORT TERM GOAL #2   Title Patient will demonstrate bilateral hip ER of at least 38 degrees bilaterally    Status Achieved   PT SHORT TERM GOAL #3   Title Patient will reduce lumbar spine rotation limitations to no more than 40% limited with pain 0/10 with supine testing    Status Achieved   PT SHORT TERM GOAL #4   Title Patient will be independent in correctly and consistently performing appropriate HEP, to be updated PRN    Baseline 09/30/2014: Reports compliance with HEP daily    Status Achieved           PT Long Term Goals - 09/30/14 1746    PT LONG TERM GOAL #1   Title Patient will demonstrate bilateral lower extremity  strength of 5/5, proximal muscle strength of at least 4/5, and core strength of at least 4-/5    Status On-going   PT LONG TERM GOAL #2   Title 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   Status On-going   PT LONG TERM GOAL #3   Title Patient to report that she has been able to perform regular exercise at a moderate intensity for at least 30 minutes, and at least 3 times per week in order to promote improved health habits and reduce symptomology    Status Achieved   PT LONG TERM GOAL #4   Title Patient will be able to perform static standing tasks and functional gait based tasks for at least 2 hours with pain no more than 1/10 and minimal R LE numbness/tingling    Status Achieved               Plan - 10/23/14 1810    Clinical Impression Statement Continued session foucs on improving proximal musculature and postural strengtheing.   Pt able to demonstrate appropriate body mechanics with lifting for works and reports increase ease completeing wound care following recommendations of therapist last session. No cueing required with proper lifting.  Pt able to demonstrate approraite form with postureal strengthening exercises, pt given theraband to add to HEP.  Pt reports abilty to go walking with son through [place] today and able to keep up gait speed and reports improved activity tolerance.  Improved form noted with core strengthening activities with slight core instabilty with activities this session.  No reports of pain through sessoin.     PT Next Visit Plan Reassess next session, anticipate discharge next session.        Problem List There are no active problems to display for this patient. [address] LPTA; CBIS [phone]   Juel Burrow 10/23/2014, 6:17  PM  [facility] [address] [place], [place], [address] Phone: [phone]   Fax:  [phone]

## 2014-10-28 ENCOUNTER — Telehealth (HOSPITAL_COMMUNITY): Payer: Self-pay

## 2014-10-28 ENCOUNTER — Ambulatory Visit (HOSPITAL_COMMUNITY): Payer: BLUE CROSS/BLUE SHIELD

## 2014-10-28 DIAGNOSIS — M545 Low back pain, unspecified: Secondary | ICD-10-CM

## 2014-10-28 DIAGNOSIS — M2560 Stiffness of unspecified joint, not elsewhere classified: Secondary | ICD-10-CM

## 2014-10-28 DIAGNOSIS — M6281 Muscle weakness (generalized): Secondary | ICD-10-CM

## 2014-10-28 DIAGNOSIS — M25652 Stiffness of left hip, not elsewhere classified: Secondary | ICD-10-CM

## 2014-10-28 DIAGNOSIS — M2569 Stiffness of other specified joint, not elsewhere classified: Secondary | ICD-10-CM

## 2014-10-28 DIAGNOSIS — R262 Difficulty in walking, not elsewhere classified: Secondary | ICD-10-CM

## 2014-10-28 DIAGNOSIS — R198 Other specified symptoms and signs involving the digestive system and abdomen: Secondary | ICD-10-CM

## 2014-10-28 DIAGNOSIS — M25651 Stiffness of right hip, not elsewhere classified: Secondary | ICD-10-CM

## 2014-10-28 DIAGNOSIS — R293 Abnormal posture: Secondary | ICD-10-CM

## 2014-10-28 NOTE — Therapy (Signed)
[place], [place], [address] Phone: [phone]   Fax:  [phone]  Physical Therapy Treatment  Patient Details  Name: Brandi Richardson MRN: [id number] Date of Birth: Jun 30, 1960 Referring Provider:  Hennie Duos, MD  Encounter Date: 10/28/2014      PT End of Session - 10/28/14 1646    Visit Number 8   Number of Visits 12   Date for PT Re-Evaluation 10/28/14   Authorization Type BCBS    Authorization Time Period 09/02/14 to 11/02/14   PT Start Time 1610   PT Stop Time 1650   PT Time Calculation (min) 40 min   Activity Tolerance Patient tolerated treatment well   Behavior During Therapy [facility] for tasks assessed/performed      No past medical history on file.  No past surgical history on file.  There were no vitals filed for this visit.  Visit Diagnosis:  Midline low back pain without sciatica  Abdominal weakness  Proximal muscle weakness  Hip stiffness, left  Hip stiffness, right  Back stiffness  Difficulty walking  Poor posture      Subjective Assessment - 10/28/14 1607    Subjective Pt stated she is feeling good today, feels ready for discharge today.  Has been compliant with her new theraband exercises and stated she likes them alot.     Pertinent History  History of cyst removal L foot. Back first started bothering her around July 25th when she went back to work after being on vacation. Had driven around 163 miles for work before vacation, vacation was a 4 hour drive. Also mows the yard on a riding lawn mower, sometimes she will go over ditch and she thinks this may have aggravated condition.    How long can you sit comfortably? Able to sit comfortably for at least 2 hours, able to sit through a movie comfortably (4 weeks it was an 1 hour)    How long can you stand comfortably? Able to stand comfortably for an 2-3 hours ( 4 weeks ago back got a little sore after about 30-45 minutes)   How long can you walk comfortably? Able to walk and complete stairs at pt.'s house for unlimited length of time (was R leg is going numb during gait of 60 minutes in duration)   Patient Stated Goals stop back and especially R hip pain- pt feels personal goal met   Currently in Pain? No/denies            [facility] PT Assessment - 10/28/14 0001    Assessment   Medical Diagnosis low back pain    Onset Date/Surgical Date 08/10/14   Next MD Visit November with Amil Amen   Observation/Other Assessments   Focus on Therapeutic Outcomes (FOTO)  24% limitation was 36%   Posture/Postural Control   Posture Comments WNL   AROM   Right Hip Internal Rotation  42  was 40   Left Hip Internal Rotation  42  was 38   Lumbar Flexion --  was 110   Lumbar Extension --  was 25   Lumbar - Right Side Bend WNL   Lumbar - Left Side Bend WNL   Lumbar - Right Rotation WNL   Lumbar - Left Rotation WNL   Strength   Overall Strength Comments gross core strength 4/5  gross core strength 2+/5   Right Hip Flexion 5/5   Right Hip Extension 4+/5  was 4/5   Right Hip ABduction 4+/5  was 4/5   Left Hip Flexion 5/5   Left Hip Extension 5/5  was 4/5   Left Hip ABduction 5/5  was 4/5   Right Knee Flexion 5/5  was 4+/5   Right Knee Extension 5/5   Left Knee Flexion 5/5  was 4+/5   Left Knee Extension 5/5   Right Ankle Dorsiflexion 5/5   Left Ankle Dorsiflexion 5/5   Flexibility   Hamstrings WNL   Piriformis WNL           [facility] Adult PT Treatment/Exercise - 10/28/14 0001    Lumbar Exercises: Standing   Lifting From 12";15 reps   Lifting Weights (lbs) Proper lifting yellow ball from 12 then heel raise with ab set   Scapular Retraction 15 reps;Theraband   Theraband Level (Scapular Retraction) Level 4 (Blue)   Row 15 reps;Theraband   Theraband Level (Row) Level 4 (Blue)   Shoulder Extension 15 reps;Theraband   Theraband Level (Shoulder Extension) Level 4 (Blue)   Other Standing Lumbar Exercises UE  flexion infront of door with ab set   Other Standing Lumbar Exercises UE overhead matrix 5x each direction with 2#   Lumbar Exercises: Supine   Large Ball Abdominal Isometric 10 reps;5 seconds   Large Ball Oblique Isometric 10 reps;5 seconds             PT Short Term Goals - 10/28/14 1615    PT SHORT TERM GOAL #1   Title Patient will be able to verbally state the importance of maintaining good posture during functional tasks and will perform all funtional tasks with good posture 80% of the time    Status Achieved   PT SHORT TERM GOAL #2   Title Patient will demonstrate bilateral hip ER of at least 38 degrees bilaterally    Status Achieved   PT SHORT TERM GOAL #3   Title Patient will reduce lumbar spine rotation limitations to no more than 40% limited with pain 0/10 with supine testing    Status Achieved   PT SHORT TERM GOAL #4   Title Patient will be independent in correctly and consistently performing appropriate HEP, to be updated PRN    Status Achieved           PT Long Term Goals - 10/28/14 1615    PT LONG TERM GOAL #1   Title Patient will demonstrate bilateral lower extremity strength of 5/5, proximal muscle strength of at least 4/5, and core strength of at least 4-/5    PT LONG TERM GOAL #2   Title 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 no more than 1/10 and minimal numbness/tingling in R LE    Baseline 10/28/2014:  Reports ability to complete all home health sessions with no reports of pain or numbness/tingling   Status Achieved   PT LONG TERM GOAL #3   Title Patient to report that she has been able to perform regular exercise at a moderate intensity for at least 30 minutes, and at least 3 times per week in order to promote improved health habits and reduce symptomology    Status Achieved   PT LONG TERM GOAL #4   Title Patient will be able to perform static standing tasks and functional gait based  tasks for at least 2 hours with pain no more than 1/10 and minimal R LE numbness/tingling    Status Achieved  Plan - 10/28/14 1647    Clinical Impression Statement Reassessment complete with the following findings:  Pt reports compliance with advanced HEP and able to demonstrate/ verbalize approraite technique with all exericse.  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  Reports improved tolerance for sitting, standing and walking with no reports of pain.  LE strength has returned to WNL, core only musculatre not 100%, pt given advanced HEP exercises for core strengthening with abilty to complete correclty.  ROM WNL for all hip and lumbar.  Pt approraite for discharge to HEP.     PT Next Visit Plan Recommend discharge to advanced HEP.        Problem List There are no active problems to display for this patient.  [address], LPTA; CBIS [phone]  Aldona Lento 10/28/2014, 4:54 PM   PHYSICAL THERAPY DISCHARGE SUMMARY  Visits from Start of Care: 8  Current functional level related to goals / functional outcomes: Patient able to compelte all work related and leisure tasks with no pain, states that she thinks she is fully ready for DC to HEP for core    Remaining deficits: Still some core weakness    Education / Equipment: Advanced HEP  Plan: Patient agrees to discharge.  Patient goals were met. Patient is being discharged due to being pleased with the current functional level.  ?????       Deniece Ree PT, DPT [place] [address] [place], [place], [address] Phone: [phone]   Fax:  [phone]

## 2014-11-25 NOTE — Telephone Encounter (Signed)
Called concerning apt date and time  Casey Cockerham, LPTA; CBIS [phone]  

## 2014-12-11 ENCOUNTER — Encounter (HOSPITAL_COMMUNITY): Payer: Self-pay | Admitting: Emergency Medicine

## 2014-12-11 ENCOUNTER — Emergency Department (HOSPITAL_COMMUNITY): Payer: Worker's Compensation

## 2014-12-11 ENCOUNTER — Emergency Department (HOSPITAL_COMMUNITY)
Admission: EM | Admit: 2014-12-11 | Discharge: 2014-12-11 | Disposition: A | Discharge status: Home / Self Care | Payer: Worker's Compensation | Attending: Emergency Medicine | Admitting: Emergency Medicine

## 2014-12-11 DIAGNOSIS — S3992XA Unspecified injury of lower back, initial encounter: Secondary | ICD-10-CM | POA: Diagnosis present

## 2014-12-11 DIAGNOSIS — Y998 Other external cause status: Secondary | ICD-10-CM | POA: Insufficient documentation

## 2014-12-11 DIAGNOSIS — S300XXA Contusion of lower back and pelvis, initial encounter: Secondary | ICD-10-CM | POA: Diagnosis not present

## 2014-12-11 DIAGNOSIS — M25561 Pain in right knee: Secondary | ICD-10-CM

## 2014-12-11 DIAGNOSIS — W108XXA Fall (on) (from) other stairs and steps, initial encounter: Secondary | ICD-10-CM | POA: Insufficient documentation

## 2014-12-11 DIAGNOSIS — Z79899 Other long term (current) drug therapy: Secondary | ICD-10-CM | POA: Insufficient documentation

## 2014-12-11 DIAGNOSIS — S8991XA Unspecified injury of right lower leg, initial encounter: Secondary | ICD-10-CM | POA: Insufficient documentation

## 2014-12-11 DIAGNOSIS — Z7982 Long term (current) use of aspirin: Secondary | ICD-10-CM | POA: Insufficient documentation

## 2014-12-11 DIAGNOSIS — Y92009 Unspecified place in unspecified non-institutional (private) residence as the place of occurrence of the external cause: Secondary | ICD-10-CM | POA: Diagnosis not present

## 2014-12-11 DIAGNOSIS — Y9301 Activity, walking, marching and hiking: Secondary | ICD-10-CM | POA: Diagnosis not present

## 2014-12-11 MED ORDER — IBUPROFEN 800 MG PO TABS
800.0000 mg | ORAL_TABLET | Freq: Once | ORAL | Status: AC
  Administered 2014-12-11: 800 mg via ORAL
  Filled 2014-12-11: qty 1

## 2014-12-11 MED ORDER — HYDROCODONE-ACETAMINOPHEN 5-325 MG PO TABS: 1.0000 | ORAL_TABLET | ORAL | Status: DC | PRN

## 2014-12-11 NOTE — Discharge Instructions (Signed)
Contusion A contusion is a deep bruise. Contusions are the result of a blunt injury to tissues and muscle fibers under the skin. The injury causes bleeding under the skin. The skin overlying the contusion may turn blue, purple, or yellow. Minor injuries will give you a painless contusion, but more severe contusions may stay painful and swollen for a few weeks.  CAUSES  This condition is usually caused by a blow, trauma, or direct force to an area of the body. SYMPTOMS  Symptoms of this condition include:  Swelling of the injured area.  Pain and tenderness in the injured area.  Discoloration. The area may have redness and then turn blue, purple, or yellow. DIAGNOSIS  This condition is diagnosed based on a physical exam and medical history. An X-ray, CT scan, or MRI may be needed to determine if there are any associated injuries, such as broken bones (fractures). TREATMENT  Specific treatment for this condition depends on what area of the body was injured. In general, the best treatment for a contusion is resting, icing, applying pressure to (compression), and elevating the injured area. This is often called the RICE strategy. Over-the-counter anti-inflammatory medicines may also be recommended for pain control.  HOME CARE INSTRUCTIONS   Rest the injured area.  If directed, apply ice to the injured area:  Put ice in a plastic bag.  Place a towel between your skin and the bag.  Leave the ice on for 20 minutes, 2-3 times per day.  If directed, apply light compression to the injured area using an elastic bandage. Make sure the bandage is not wrapped too tightly. Remove and reapply the bandage as directed by your health care provider.  If possible, raise (elevate) the injured area above the level of your heart while you are sitting or lying down.  Take over-the-counter and prescription medicines only as told by your health care provider. SEEK MEDICAL CARE IF:  Your symptoms do not  improve after several days of treatment.  Your symptoms get worse.  You have difficulty moving the injured area. SEEK IMMEDIATE MEDICAL CARE IF:   You have severe pain.  You have numbness in a hand or foot.  Your hand or foot turns pale or cold.   This information is not intended to replace advice given to you by your health care provider. Make sure you discuss any questions you have with your health care provider.   Document Released: 10/12/2004 Document Revised: 09/23/2014 Document Reviewed: 05/20/2014 Elsevier Interactive Patient Education [employer]2016 [employer]   Your x-rays are negative for any bony injury or dislocation.  I suspect to have deep bruising of your lower back and pelvis.  Your knee x-ray is also without obvious injury.  I recommend using ice, elevation and rest over the next several days.  Expect gradual improvement in your symptoms.  You may add a heating pad 2 year areas of injury starting on Monday, I recommend 20 minutes several times daily.  Follow-up with your doctor for recheck of your symptoms are not improving over the next week.  You may take the hydrocodone prescribed for pain relief.  This will make you drowsy - do not drive within 4 hours of taking this medication.

## 2014-12-11 NOTE — ED Notes (Signed)
Pt states that she missed a step in a patient's house today and fell.  C/o low back pain, right hip pain, and right knee pain.

## 2014-12-12 NOTE — ED Provider Notes (Signed)
CSN: [id number]     Arrival date & time 12/11/14  1655 History   First MD Initiated Contact with Patient 12/11/14 1742     Chief Complaint  Patient presents with  . Fall  . Back Pain     (Consider location/radiation/quality/duration/timing/severity/associated sxs/prior Treatment) The history is provided by the patient.   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 She walked into a darkened bedroom, not knowing there was a step down, and fell, landing with her right leg bent at the knee and folder underneath her. She has been ambulatory since the event and denies head injury, headache, neck or upper back pain.  She has had no medication prior to arrival.  Her pain is constant with movement and weight bearing and resolves with rest.    History reviewed. No pertinent past medical history. Past Surgical History  Procedure Laterality Date  . Parathyroidectomy    . Cesarean section     History reviewed. No pertinent family history. Social History  Substance Use Topics  . Smoking status: Never Smoker   . Smokeless tobacco: None  . Alcohol Use: No   OB History    No data available     Review of Systems  HENT: Negative.   Respiratory: Negative for shortness of breath.   Cardiovascular: Negative for chest pain.  Gastrointestinal: Negative.   Musculoskeletal: Positive for back pain and arthralgias. Negative for myalgias, joint swelling and neck pain.  Neurological: Negative for weakness and numbness.      Allergies  Contrast media and Levaquin  Home Medications   Prior to Admission medications   Medication Sig Start Date End Date Taking? Authorizing Provider  Adalimumab (HUMIRA) 40 MG/0.8ML PSKT Inject 40 mg into the skin every 14 (fourteen) days.   Yes Historical Provider, MD  aspirin EC 81 MG tablet Take 81 mg by mouth daily.   Yes  Historical Provider, MD  folic acid (FOLVITE) 1 MG tablet Take 1 mg by mouth 2 (two) times daily.    Yes Historical Provider, MD  glucosamine-chondroitin 500-400 MG tablet Take 1 tablet by mouth daily.    Yes Historical Provider, MD  methotrexate (RHEUMATREX) 2.5 MG tablet Take 12.5 mg by mouth every Wednesday. Caution:Chemotherapy. Protect from light.   Yes Historical Provider, MD   BP 137/97 mmHg  Pulse 71  Temp(Src) 98 F (36.7 C) (Oral)  Resp 16  Ht  (1.651 m)  Wt 104.327 kg  BMI 38.27 kg/m2  SpO2 100% Physical Exam  Constitutional: She appears well-developed and well-nourished.  HENT:  Head: Normocephalic.  Eyes: Conjunctivae are normal.  Neck: Normal range of motion. Neck supple.  Cardiovascular: Normal rate and intact distal pulses.   Pedal pulses normal.  Pulmonary/Chest: Effort normal.  Abdominal: Soft. Bowel sounds are normal. She exhibits no distension and no mass.  Musculoskeletal: Normal range of motion. She exhibits no edema.       Right knee: She exhibits bony tenderness. She exhibits no swelling, no effusion, no ecchymosis, no deformity, normal alignment, no LCL laxity, normal meniscus and no MCL laxity. Tenderness found. Medial joint line tenderness noted.       Lumbar back: She exhibits tenderness. She exhibits no swelling, no edema and no spasm.       Back:  ttp at right posterior pelvis. No bruising or hematoma appreciated.  Mild midline  lumbar ttp, no deformity or step offs. Pt is ambulatory.  Neurological: She is alert. She has normal strength. She displays no atrophy and no tremor. No sensory deficit. Gait normal.  Reflex Scores:      Patellar reflexes are 2+ on the right side and 2+ on the left side.      Achilles reflexes are 2+ on the right side and 2+ on the left side. No strength deficit noted in hip and knee flexor and extensor muscle groups.  Ankle flexion and extension intact.  Skin: Skin is warm and dry.  Psychiatric: She has a normal mood and  affect.  Nursing note and vitals reviewed.   ED Course  Procedures (including critical care time) Labs Review Labs Reviewed - No data to display  Imaging Review Dg Lumbar Spine Complete  12/11/2014  CLINICAL DATA:  Fall down steps this afternoon with low back pain. EXAM: LUMBAR SPINE - COMPLETE 4+ VIEW COMPARISON:  12/01/2008 and MRI 12/05/2008 FINDINGS: Vertebral body alignment and heights are within normal. There is mild spondylosis throughout the lumbar spine and lower thoracic spine. Facet arthropathy is present over the mid to lower lumbar spine. There is mild disc space narrowing at the L2-3, L3-4 L4-5 levels. Minimal regularity along the superior endplate of T12 without significant change. IMPRESSION: Mild spondylosis of the lumbar spine with mild disc disease from the L2-3 level to the L4-5 level. No acute findings. Electronically Signed   By: Elberta Fortisaniel  Boyle M.D.   On: 12/11/2014 18:29   Dg Pelvis 1-2 Views  12/11/2014  CLINICAL DATA:  Pt states that she missed a step in a patient's house today and fell around 2:30pm. C/o low back pain, right hip pain, and right knee pain. Pt denies any surgery to back or right knee. Pt states she has had a c-section. EXAM: PELVIS - 1-2 VIEW COMPARISON:  CT 06/16/2003 FINDINGS: Hips are located. No evidence of femoral neck fracture, pelvic fracture sacral fracture. IMPRESSION: No acute osseous abnormality.  No fracture or dislocation. Electronically Signed   By: Genevive BiStewart  Edmunds M.D.   On: 12/11/2014 18:29   Dg Knee Complete 4 Views Right  12/11/2014  CLINICAL DATA:  Low back pain.  Fall on steps.  Knee pain EXAM: RIGHT KNEE - COMPLETE 4+ VIEW COMPARISON:  None. FINDINGS: No fracture of the proximal tibia or distal femur. Patella is normal. No joint effusion. IMPRESSION: No fracture or dislocation. Electronically Signed   By: Genevive BiStewart  Edmunds M.D.   On: 12/11/2014 18:27   I have personally reviewed and evaluated these images and lab results as part of my  medical decision-making.   EKG Interpretation None      MDM   Final diagnoses:  Contusion of lower back, initial encounter  Knee pain, acute, right    xrays reviewed and discussed with patient.  Advised ice tx x 2 days, heat starting on day 3.  Activity as tolerated, tylenol prn, hydrocodone (pt states has this med, not prescribed today).  Plan f/u with pcp or employers preferred occ med provider if sx persist or are not improving over the next week.  The patient appears reasonably screened and/or stabilized for discharge and I doubt any other medical condition or other [facility] requiring further screening, evaluation, or treatment in the ED at this time prior to discharge.     Burgess AmorJulie Osceola Depaz, PA-C 12/12/14 1429  Glynn OctaveStephen Rancour, MD 12/12/14 [phone]

## 2016-09-06 ENCOUNTER — Ambulatory Visit (HOSPITAL_COMMUNITY)
Admission: RE | Admit: 2016-09-06 | Discharge: 2016-09-06 | Disposition: A | Discharge status: Home / Self Care | Payer: BLUE CROSS/BLUE SHIELD | Source: Ambulatory Visit | Attending: Family Medicine | Admitting: Family Medicine

## 2016-09-06 ENCOUNTER — Other Ambulatory Visit (HOSPITAL_COMMUNITY): Payer: Self-pay | Admitting: Family Medicine

## 2016-09-06 DIAGNOSIS — R52 Pain, unspecified: Secondary | ICD-10-CM

## 2016-09-06 DIAGNOSIS — M25552 Pain in left hip: Secondary | ICD-10-CM | POA: Insufficient documentation

## 2016-09-06 DIAGNOSIS — M1612 Unilateral primary osteoarthritis, left hip: Secondary | ICD-10-CM | POA: Insufficient documentation

## 2017-05-07 DIAGNOSIS — M0589 Other rheumatoid arthritis with rheumatoid factor of multiple sites: Secondary | ICD-10-CM | POA: Diagnosis not present

## 2017-05-07 DIAGNOSIS — Z79899 Other long term (current) drug therapy: Secondary | ICD-10-CM | POA: Diagnosis not present

## 2017-05-07 DIAGNOSIS — L603 Nail dystrophy: Secondary | ICD-10-CM | POA: Diagnosis not present

## 2017-05-07 DIAGNOSIS — M150 Primary generalized (osteo)arthritis: Secondary | ICD-10-CM | POA: Diagnosis not present

## 2017-05-07 DIAGNOSIS — Z6841 Body Mass Index (BMI) 40.0 and over, adult: Secondary | ICD-10-CM | POA: Diagnosis not present

## 2017-05-07 DIAGNOSIS — M5136 Other intervertebral disc degeneration, lumbar region: Secondary | ICD-10-CM | POA: Diagnosis not present

## 2017-05-07 DIAGNOSIS — J40 Bronchitis, not specified as acute or chronic: Secondary | ICD-10-CM | POA: Diagnosis not present

## 2017-08-02 DIAGNOSIS — Z6839 Body mass index (BMI) 39.0-39.9, adult: Secondary | ICD-10-CM | POA: Diagnosis not present

## 2017-08-02 DIAGNOSIS — J40 Bronchitis, not specified as acute or chronic: Secondary | ICD-10-CM | POA: Diagnosis not present

## 2017-08-02 DIAGNOSIS — M150 Primary generalized (osteo)arthritis: Secondary | ICD-10-CM | POA: Diagnosis not present

## 2017-08-02 DIAGNOSIS — Z79899 Other long term (current) drug therapy: Secondary | ICD-10-CM | POA: Diagnosis not present

## 2017-08-02 DIAGNOSIS — M5136 Other intervertebral disc degeneration, lumbar region: Secondary | ICD-10-CM | POA: Diagnosis not present

## 2017-08-02 DIAGNOSIS — M0589 Other rheumatoid arthritis with rheumatoid factor of multiple sites: Secondary | ICD-10-CM | POA: Diagnosis not present

## 2017-08-02 DIAGNOSIS — L603 Nail dystrophy: Secondary | ICD-10-CM | POA: Diagnosis not present

## 2017-11-02 DIAGNOSIS — Z79899 Other long term (current) drug therapy: Secondary | ICD-10-CM | POA: Diagnosis not present

## 2017-11-02 DIAGNOSIS — Z6841 Body Mass Index (BMI) 40.0 and over, adult: Secondary | ICD-10-CM | POA: Diagnosis not present

## 2017-11-02 DIAGNOSIS — M150 Primary generalized (osteo)arthritis: Secondary | ICD-10-CM | POA: Diagnosis not present

## 2017-11-02 DIAGNOSIS — M5136 Other intervertebral disc degeneration, lumbar region: Secondary | ICD-10-CM | POA: Diagnosis not present

## 2017-11-02 DIAGNOSIS — M0589 Other rheumatoid arthritis with rheumatoid factor of multiple sites: Secondary | ICD-10-CM | POA: Diagnosis not present

## 2017-11-02 DIAGNOSIS — L603 Nail dystrophy: Secondary | ICD-10-CM | POA: Diagnosis not present

## 2018-03-07 DIAGNOSIS — B351 Tinea unguium: Secondary | ICD-10-CM | POA: Diagnosis not present

## 2018-03-07 DIAGNOSIS — Z1389 Encounter for screening for other disorder: Secondary | ICD-10-CM | POA: Diagnosis not present

## 2018-03-07 DIAGNOSIS — Z6841 Body Mass Index (BMI) 40.0 and over, adult: Secondary | ICD-10-CM | POA: Diagnosis not present

## 2018-11-04 ENCOUNTER — Ambulatory Visit (INDEPENDENT_AMBULATORY_CARE_PROVIDER_SITE_OTHER): Payer: 59

## 2018-11-04 ENCOUNTER — Ambulatory Visit
Admission: EM | Admit: 2018-11-04 | Discharge: 2018-11-04 | Disposition: A | Discharge status: Home / Self Care | Payer: 59 | Attending: Emergency Medicine | Admitting: Emergency Medicine

## 2018-11-04 ENCOUNTER — Other Ambulatory Visit: Payer: Self-pay

## 2018-11-04 DIAGNOSIS — S52602A Unspecified fracture of lower end of left ulna, initial encounter for closed fracture: Secondary | ICD-10-CM | POA: Diagnosis not present

## 2018-11-04 DIAGNOSIS — S62115A Nondisplaced fracture of triquetrum [cuneiform] bone, left wrist, initial encounter for closed fracture: Secondary | ICD-10-CM

## 2018-11-04 DIAGNOSIS — S52502A Unspecified fracture of the lower end of left radius, initial encounter for closed fracture: Secondary | ICD-10-CM

## 2018-11-04 IMAGING — DX DG WRIST COMPLETE 3+V*L*
3 series · 3 of 3 positions shown · non-contrast
Comparison: None.

CLINICAL DATA: Fall

EXAM:
LEFT WRIST - COMPLETE 3+ VIEW

[wrist pa]
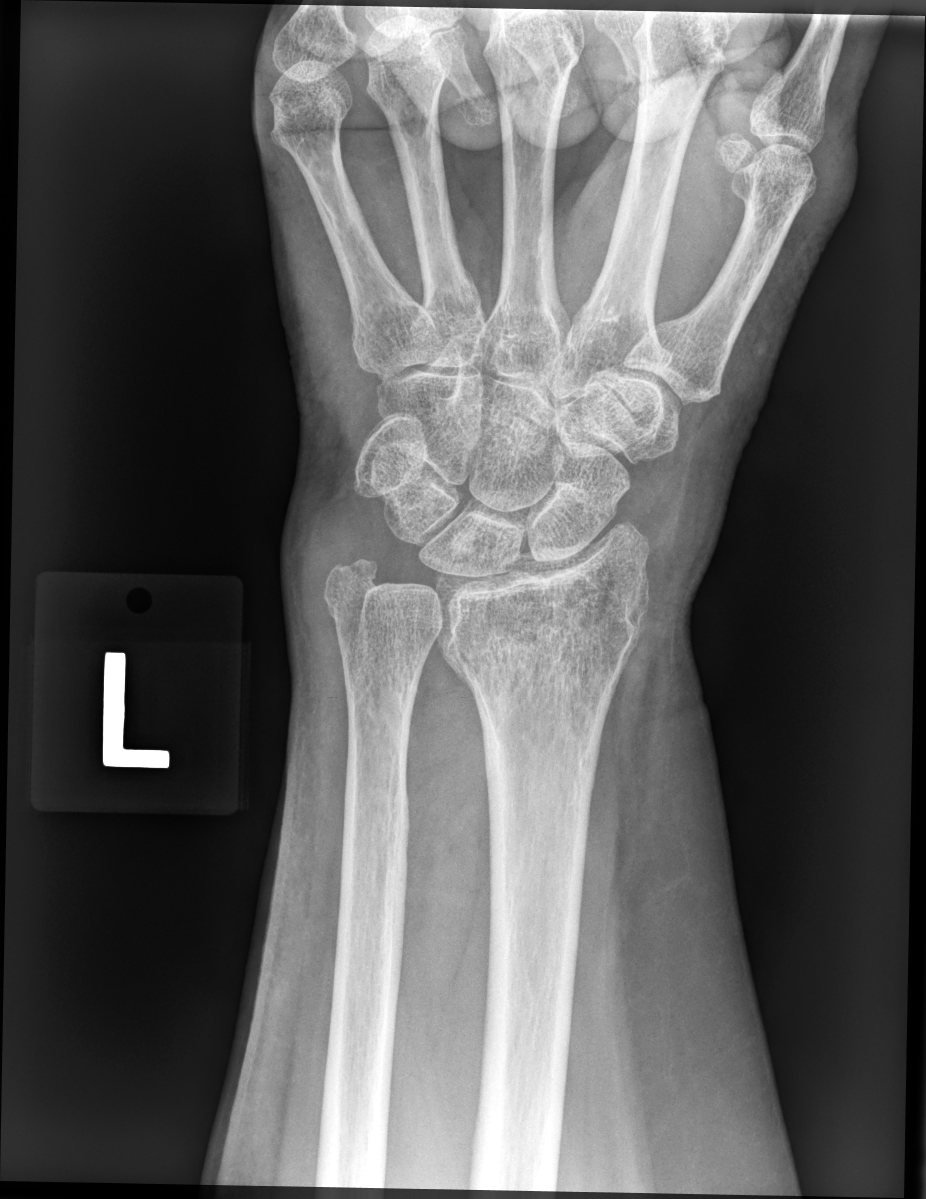

[wrist mlo]
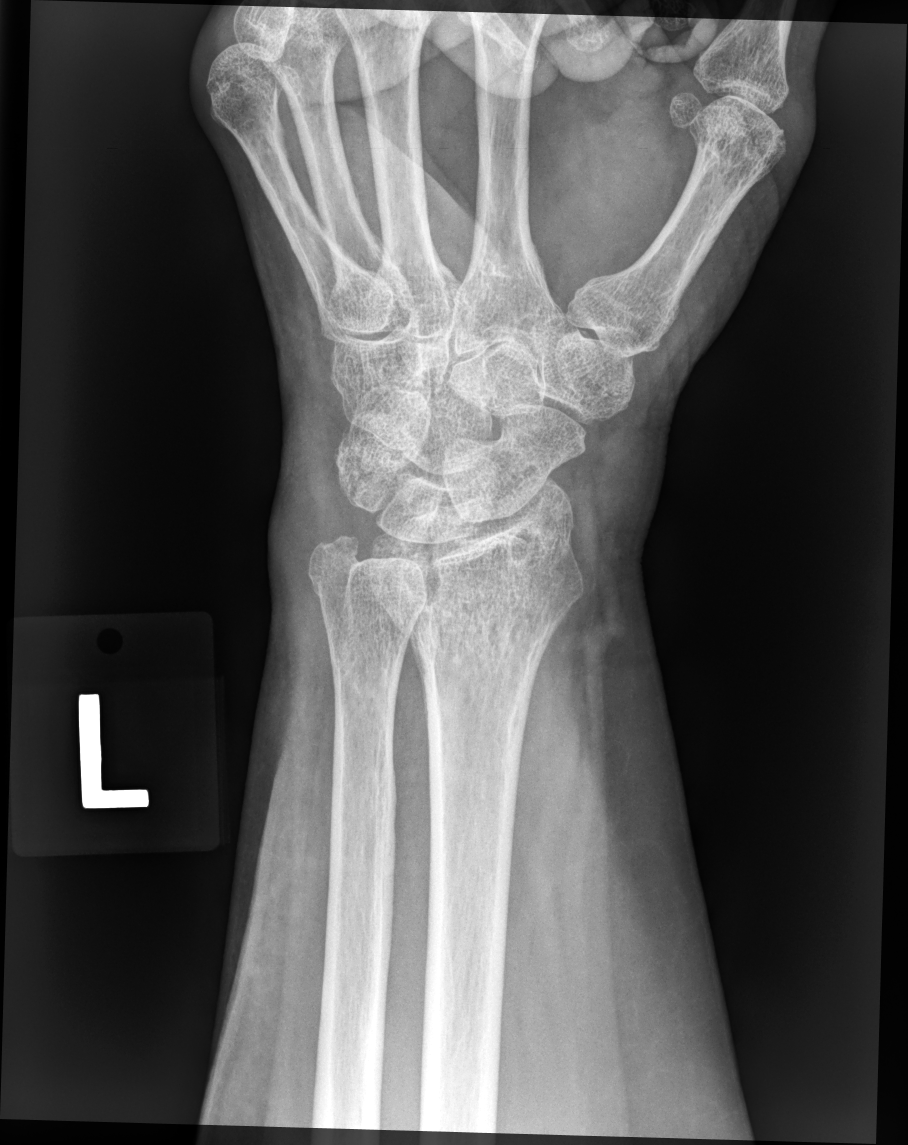

[wrist lat]
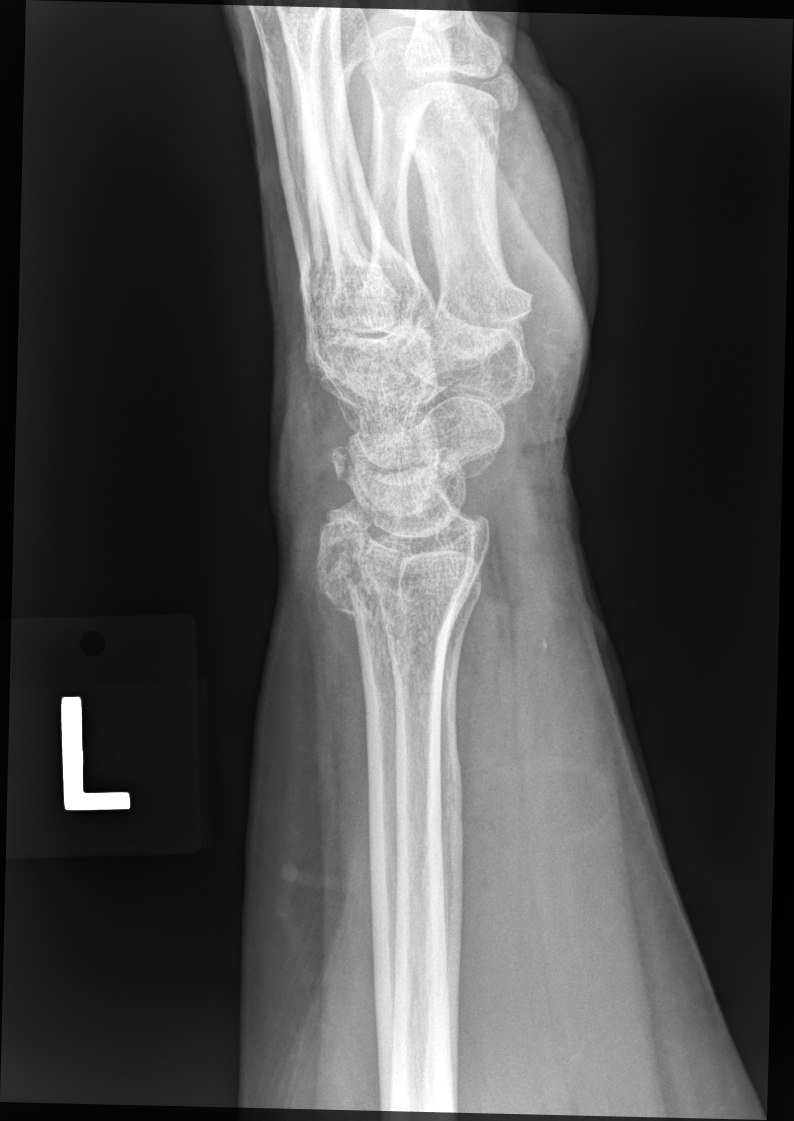

[3 of 3 positions shown; findings below may reference images not displayed]

FINDINGS: Fracture of the dorsal cortex of the distal radius extending into
the joint. Nondisplaced fracture ulnar styloid.

Fracture of the dorsal proximal carpal bone which appears to be the
triquetrum.

Diffuse soft tissue swelling of the wrist.
IMPRESSION: Fracture distal radius and ulna. Dorsal fracture of the triquetrum.
Diffuse soft tissue swelling.

## 2018-11-04 NOTE — ED Provider Notes (Signed)
[facility]   [id number] 11/04/18 Arrival Time: 1155  CC: Left wrist injury  SUBJECTIVE: History from: patient. Brandi Richardson is a 58 y.o. female complains of left wrist injury and pain that occurred 1.5 hours ago.  Fall from back porch, approximately 3 feet, and land on left outstretched hand.  Localizes the pain to the outside of wrist.  Describes the pain as constant and sharp in character.  Has tried RT hand wrist splint without relief.  Symptoms are made worse with wrist ROM.  Denies similar symptoms in the past.  Complains of swelling, bruising, and numbness/ tingling.  Denies fever, chills, erythema, weakness.  ROS: As per HPI.  All other pertinent ROS negative.     History reviewed. No pertinent past medical history. Past Surgical History:  Procedure Laterality Date  . CESAREAN SECTION    . PARATHYROIDECTOMY     Allergies  Allergen Reactions  . Contrast Media [Iodinated Diagnostic Agents] Anaphylaxis  . Levaquin [Levofloxacin] Anaphylaxis   No current facility-administered medications on file prior to encounter.    Current Outpatient Medications on File Prior to Encounter  Medication Sig Dispense Refill  . Adalimumab (HUMIRA) 40 MG/0.8ML PSKT Inject 40 mg into the skin every 14 (fourteen) days.    Marland Kitchen aspirin EC 81 MG tablet Take 81 mg by mouth daily.    . folic acid (FOLVITE) 1 MG tablet Take 1 mg by mouth 2 (two) times daily.     Marland Kitchen glucosamine-chondroitin 500-400 MG tablet Take 1 tablet by mouth daily.     . methotrexate (RHEUMATREX) 2.5 MG tablet Take 12.5 mg by mouth every Wednesday. Caution:Chemotherapy. Protect from light.     Social History   Socioeconomic History  . Marital status: Single    Spouse name: Not on file  . Number of children: Not on file  . Years of education: Not on file  . Highest education level: Not on file  Occupational History  . Not on file  Social Needs  . Financial resource strain: Not on file  . Food insecurity   Worry: Not on file    Inability: Not on file  . Transportation needs    Medical: Not on file    Non-medical: Not on file  Tobacco Use  . Smoking status: Never Smoker  . Smokeless tobacco: Never Used  Substance and Sexual Activity  . Alcohol use: No  . Drug use: No  . Sexual activity: Not on file  Lifestyle  . Physical activity    Days per week: Not on file    Minutes per session: Not on file  . Stress: Not on file  Relationships  . Social Musician on phone: Not on file    Gets together: Not on file    Attends religious service: Not on file    Active member of club or organization: Not on file    Attends meetings of clubs or organizations: Not on file    Relationship status: Not on file  . Intimate partner violence    Fear of current or ex partner: Not on file    Emotionally abused: Not on file    Physically abused: Not on file    Forced sexual activity: Not on file  Other Topics Concern  . Not on file  Social History Narrative  . Not on file   Family History  Problem Relation Age of Onset  . Healthy Mother   . Healthy Father     OBJECTIVE:  Vitals:   11/04/18 1250  BP: (!) 151/81  Pulse: 70  Resp: 16  Temp: 97.7 F (36.5 C)  TempSrc: Oral  SpO2: 98%    General appearance: ALERT; in no acute distress.  Head: NCAT Lungs: Normal respiratory effort CV: Radial pulses 2+ bilaterally. Cap refill < 2 seconds Musculoskeletal: Left wrist Inspection: Skin warm, dry, clear and intact without obvious erythema, effusion, or ecchymosis. Abrasion left medial forearm Palpation: Diffusely TTP over medial and lateral wrist ROM: LROM Strength: deferred Skin: warm and dry Neurologic: Ambulates without difficulty; Sensation intact about the upper/ lower extremities Psychological: alert and cooperative; normal mood and affect  DIAGNOSTIC STUDIES:  Dg Wrist Complete Left  Result Date: 11/04/2018 CLINICAL DATA:  Fall EXAM: LEFT WRIST - COMPLETE 3+ VIEW  COMPARISON:  None. FINDINGS: Fracture of the dorsal cortex of the distal radius extending into the joint. Nondisplaced fracture ulnar styloid. Fracture of the dorsal proximal carpal bone which appears to be the triquetrum. Diffuse soft tissue swelling of the wrist. IMPRESSION: Fracture distal radius and ulna. Dorsal fracture of the triquetrum. Diffuse soft tissue swelling. Electronically Signed   By: Franchot Gallo M.D.   On: 11/04/2018 13:13    X-rays concerning for distal radius and ulnar fractures.    I have reviewed the x-rays myself and the radiologist interpretation. I am in agreement with the radiologist interpretation.     ASSESSMENT & PLAN:  1. Closed fracture of distal ends of left radius and ulna, initial encounter   2. Nondisplaced fracture of triquetrum (cuneiform) bone, left wrist, initial encounter for closed fracture     Fracture of the distal ulna and radius Wrist splint placed.  Wear at all times until cleared by orthopedist Continue conservative management of rest, ice, and elevation Take mobic as needed for pain and inflammation Follow up with orthopedist in 1-2 weeks for recheck and to ensure your symptoms are improving Return or go to the ER if you have any new or worsening symptoms (fever, chills, chest pain, abdominal pain, changes in bowel or bladder habits, pain radiating into lower legs, etc...)   Reviewed expectations re: course of current medical issues. Questions answered. Outlined signs and symptoms indicating need for more acute intervention. Patient verbalized understanding. After Visit Summary given.    Lestine Box, PA-C 11/04/18 1510

## 2018-11-04 NOTE — Discharge Instructions (Signed)
Fracture of the distal ulna and radius Wrist splint placed.  Wear at all times until cleared by orthopedist Continue conservative management of rest, ice, and elevation Take mobic as needed for pain and inflammation Follow up with orthopedist in 1-2 weeks for recheck and to ensure your symptoms are improving Return or go to the ER if you have any new or worsening symptoms (fever, chills, chest pain, abdominal pain, changes in bowel or bladder habits, pain radiating into lower legs, etc...)

## 2018-11-04 NOTE — ED Triage Notes (Signed)
Pt presents to UC w/ c/o left wrist injury

## 2018-11-05 ENCOUNTER — Telehealth: Payer: Self-pay | Admitting: Orthopaedic Surgery

## 2018-11-05 NOTE — Telephone Encounter (Signed)
We received notification from Urgent Care that this patient needed an appointment.  I called her this morning and told her that we had a cancellation and asked if she wanted to come in.  She said Urgent Care already got her scheduled at [facility] in [place].  This appointment is for 11/17/18.  She said she would just keep that appointment.  I told her that was absolutely her choice to make

## 2021-02-25 DIAGNOSIS — S93601A Unspecified sprain of right foot, initial encounter: Secondary | ICD-10-CM | POA: Diagnosis not present

## 2021-02-25 DIAGNOSIS — M7661 Achilles tendinitis, right leg: Secondary | ICD-10-CM | POA: Diagnosis not present

## 2021-02-25 DIAGNOSIS — M79671 Pain in right foot: Secondary | ICD-10-CM | POA: Diagnosis not present

## 2021-03-01 DIAGNOSIS — Z79899 Other long term (current) drug therapy: Secondary | ICD-10-CM | POA: Diagnosis not present

## 2021-03-01 DIAGNOSIS — M0589 Other rheumatoid arthritis with rheumatoid factor of multiple sites: Secondary | ICD-10-CM | POA: Diagnosis not present

## 2021-03-31 DIAGNOSIS — M0589 Other rheumatoid arthritis with rheumatoid factor of multiple sites: Secondary | ICD-10-CM | POA: Diagnosis not present

## 2021-03-31 DIAGNOSIS — M5136 Other intervertebral disc degeneration, lumbar region: Secondary | ICD-10-CM | POA: Diagnosis not present

## 2021-03-31 DIAGNOSIS — Z79899 Other long term (current) drug therapy: Secondary | ICD-10-CM | POA: Diagnosis not present

## 2021-03-31 DIAGNOSIS — E559 Vitamin D deficiency, unspecified: Secondary | ICD-10-CM | POA: Diagnosis not present

## 2021-04-26 DIAGNOSIS — M0589 Other rheumatoid arthritis with rheumatoid factor of multiple sites: Secondary | ICD-10-CM | POA: Diagnosis not present

## 2021-04-26 DIAGNOSIS — Z79899 Other long term (current) drug therapy: Secondary | ICD-10-CM | POA: Diagnosis not present

## 2021-06-21 DIAGNOSIS — Z79899 Other long term (current) drug therapy: Secondary | ICD-10-CM | POA: Diagnosis not present

## 2021-06-21 DIAGNOSIS — M0589 Other rheumatoid arthritis with rheumatoid factor of multiple sites: Secondary | ICD-10-CM | POA: Diagnosis not present

## 2021-08-01 DIAGNOSIS — Z6841 Body Mass Index (BMI) 40.0 and over, adult: Secondary | ICD-10-CM | POA: Diagnosis not present

## 2021-08-01 DIAGNOSIS — D84821 Immunodeficiency due to drugs: Secondary | ICD-10-CM | POA: Diagnosis not present

## 2021-08-01 DIAGNOSIS — E559 Vitamin D deficiency, unspecified: Secondary | ICD-10-CM | POA: Diagnosis not present

## 2021-08-01 DIAGNOSIS — M0579 Rheumatoid arthritis with rheumatoid factor of multiple sites without organ or systems involvement: Secondary | ICD-10-CM | POA: Diagnosis not present

## 2021-10-24 DIAGNOSIS — D84821 Immunodeficiency due to drugs: Secondary | ICD-10-CM | POA: Diagnosis not present

## 2021-10-24 DIAGNOSIS — M0579 Rheumatoid arthritis with rheumatoid factor of multiple sites without organ or systems involvement: Secondary | ICD-10-CM | POA: Diagnosis not present

## 2021-10-24 DIAGNOSIS — E559 Vitamin D deficiency, unspecified: Secondary | ICD-10-CM | POA: Diagnosis not present

## 2021-10-24 DIAGNOSIS — M1990 Unspecified osteoarthritis, unspecified site: Secondary | ICD-10-CM | POA: Diagnosis not present

## 2021-11-09 DIAGNOSIS — Z13820 Encounter for screening for osteoporosis: Secondary | ICD-10-CM | POA: Diagnosis not present

## 2021-12-12 DIAGNOSIS — Z79899 Other long term (current) drug therapy: Secondary | ICD-10-CM | POA: Diagnosis not present

## 2021-12-12 DIAGNOSIS — M0579 Rheumatoid arthritis with rheumatoid factor of multiple sites without organ or systems involvement: Secondary | ICD-10-CM | POA: Diagnosis not present

## 2021-12-12 DIAGNOSIS — E7800 Pure hypercholesterolemia, unspecified: Secondary | ICD-10-CM | POA: Diagnosis not present

## 2021-12-13 DIAGNOSIS — E559 Vitamin D deficiency, unspecified: Secondary | ICD-10-CM | POA: Diagnosis not present

## 2021-12-13 DIAGNOSIS — M0579 Rheumatoid arthritis with rheumatoid factor of multiple sites without organ or systems involvement: Secondary | ICD-10-CM | POA: Diagnosis not present

## 2021-12-19 DIAGNOSIS — Z6841 Body Mass Index (BMI) 40.0 and over, adult: Secondary | ICD-10-CM | POA: Diagnosis not present

## 2021-12-19 DIAGNOSIS — D84821 Immunodeficiency due to drugs: Secondary | ICD-10-CM | POA: Diagnosis not present

## 2021-12-19 DIAGNOSIS — M0579 Rheumatoid arthritis with rheumatoid factor of multiple sites without organ or systems involvement: Secondary | ICD-10-CM | POA: Diagnosis not present

## 2022-02-08 DIAGNOSIS — R739 Hyperglycemia, unspecified: Secondary | ICD-10-CM | POA: Diagnosis not present

## 2022-02-08 DIAGNOSIS — E559 Vitamin D deficiency, unspecified: Secondary | ICD-10-CM | POA: Diagnosis not present

## 2022-02-08 DIAGNOSIS — E892 Postprocedural hypoparathyroidism: Secondary | ICD-10-CM | POA: Diagnosis not present

## 2022-02-08 DIAGNOSIS — E7800 Pure hypercholesterolemia, unspecified: Secondary | ICD-10-CM | POA: Diagnosis not present

## 2022-02-15 DIAGNOSIS — M0579 Rheumatoid arthritis with rheumatoid factor of multiple sites without organ or systems involvement: Secondary | ICD-10-CM | POA: Diagnosis not present

## 2022-02-15 DIAGNOSIS — Z124 Encounter for screening for malignant neoplasm of cervix: Secondary | ICD-10-CM | POA: Diagnosis not present

## 2022-02-15 DIAGNOSIS — Z139 Encounter for screening, unspecified: Secondary | ICD-10-CM | POA: Diagnosis not present

## 2022-02-15 DIAGNOSIS — Z6841 Body Mass Index (BMI) 40.0 and over, adult: Secondary | ICD-10-CM | POA: Diagnosis not present

## 2022-02-15 DIAGNOSIS — Z0000 Encounter for general adult medical examination without abnormal findings: Secondary | ICD-10-CM | POA: Diagnosis not present

## 2022-02-15 DIAGNOSIS — E7800 Pure hypercholesterolemia, unspecified: Secondary | ICD-10-CM | POA: Diagnosis not present

## 2022-02-15 DIAGNOSIS — Z79899 Other long term (current) drug therapy: Secondary | ICD-10-CM | POA: Diagnosis not present

## 2022-02-22 DIAGNOSIS — Z1231 Encounter for screening mammogram for malignant neoplasm of breast: Secondary | ICD-10-CM | POA: Diagnosis not present

## 2022-02-28 DIAGNOSIS — B029 Zoster without complications: Secondary | ICD-10-CM | POA: Diagnosis not present

## 2022-02-28 DIAGNOSIS — M0579 Rheumatoid arthritis with rheumatoid factor of multiple sites without organ or systems involvement: Secondary | ICD-10-CM | POA: Diagnosis not present

## 2022-02-28 DIAGNOSIS — Z79899 Other long term (current) drug therapy: Secondary | ICD-10-CM | POA: Diagnosis not present

## 2022-03-14 DIAGNOSIS — Z1211 Encounter for screening for malignant neoplasm of colon: Secondary | ICD-10-CM | POA: Diagnosis not present

## 2022-04-03 DIAGNOSIS — R928 Other abnormal and inconclusive findings on diagnostic imaging of breast: Secondary | ICD-10-CM | POA: Diagnosis not present

## 2022-04-13 DIAGNOSIS — M0579 Rheumatoid arthritis with rheumatoid factor of multiple sites without organ or systems involvement: Secondary | ICD-10-CM | POA: Diagnosis not present
